# Patient Record
Sex: Male | Born: 1955 | Race: White | Hispanic: No | Marital: Married | Smoking: Former smoker
Health system: Southern US, Community
[De-identification: ages and names within clinical notes are randomized; demographics above are authoritative.]

## PROBLEM LIST (undated history)

## (undated) DIAGNOSIS — G43909 Migraine, unspecified, not intractable, without status migrainosus: Secondary | ICD-10-CM

## (undated) DIAGNOSIS — N50819 Testicular pain, unspecified: Secondary | ICD-10-CM

## (undated) DIAGNOSIS — T7840XA Allergy, unspecified, initial encounter: Secondary | ICD-10-CM

## (undated) DIAGNOSIS — M1990 Unspecified osteoarthritis, unspecified site: Secondary | ICD-10-CM

## (undated) DIAGNOSIS — G8929 Other chronic pain: Secondary | ICD-10-CM

## (undated) DIAGNOSIS — Z973 Presence of spectacles and contact lenses: Secondary | ICD-10-CM

## (undated) DIAGNOSIS — C801 Malignant (primary) neoplasm, unspecified: Secondary | ICD-10-CM

## (undated) HISTORY — PX: TONSILLECTOMY: SUR1361

## (undated) HISTORY — PX: NASAL SEPTUM SURGERY: SHX37

## (undated) HISTORY — DX: Malignant (primary) neoplasm, unspecified: C80.1

## (undated) HISTORY — DX: Allergy, unspecified, initial encounter: T78.40XA

## (undated) HISTORY — PX: ORCHIOPEXY: SUR915

## (undated) HISTORY — PX: ORCHIECTOMY: SHX2116

---

## 1996-08-17 HISTORY — PX: NASAL SEPTUM SURGERY: SHX37

## 1998-11-29 ENCOUNTER — Other Ambulatory Visit: Admission: RE | Admit: 1998-11-29 | Discharge: 1998-11-29 | Payer: Self-pay | Admitting: Urology

## 2003-04-11 ENCOUNTER — Encounter: Payer: Self-pay | Admitting: Internal Medicine

## 2003-04-11 ENCOUNTER — Encounter: Admission: RE | Admit: 2003-04-11 | Discharge: 2003-04-11 | Payer: Self-pay | Admitting: Internal Medicine

## 2004-02-01 ENCOUNTER — Encounter: Admission: RE | Admit: 2004-02-01 | Discharge: 2004-02-01 | Payer: Self-pay | Admitting: Internal Medicine

## 2005-12-03 ENCOUNTER — Ambulatory Visit (HOSPITAL_COMMUNITY): Admission: RE | Admit: 2005-12-03 | Discharge: 2005-12-03 | Payer: Self-pay | Admitting: Orthopedic Surgery

## 2007-08-16 ENCOUNTER — Ambulatory Visit: Payer: Self-pay | Admitting: Gastroenterology

## 2007-08-23 ENCOUNTER — Ambulatory Visit: Payer: Self-pay | Admitting: Gastroenterology

## 2008-08-17 HISTORY — PX: COLONOSCOPY: SHX174

## 2014-12-04 ENCOUNTER — Other Ambulatory Visit: Payer: Self-pay | Admitting: Urology

## 2014-12-06 ENCOUNTER — Encounter (HOSPITAL_BASED_OUTPATIENT_CLINIC_OR_DEPARTMENT_OTHER): Payer: Self-pay | Admitting: *Deleted

## 2014-12-06 NOTE — Progress Notes (Signed)
NPO AFTER MN.  ARRIVE AT 0645.  NEEDS HG.   

## 2014-12-10 ENCOUNTER — Encounter (HOSPITAL_BASED_OUTPATIENT_CLINIC_OR_DEPARTMENT_OTHER): Payer: Self-pay | Admitting: *Deleted

## 2014-12-10 ENCOUNTER — Encounter (HOSPITAL_BASED_OUTPATIENT_CLINIC_OR_DEPARTMENT_OTHER)
Admission: RE | Disposition: A | Discharge status: Home / Self Care | Payer: Self-pay | Source: Ambulatory Visit | Attending: Urology

## 2014-12-10 ENCOUNTER — Ambulatory Visit (HOSPITAL_BASED_OUTPATIENT_CLINIC_OR_DEPARTMENT_OTHER): Payer: BLUE CROSS/BLUE SHIELD | Admitting: Anesthesiology

## 2014-12-10 ENCOUNTER — Ambulatory Visit (HOSPITAL_BASED_OUTPATIENT_CLINIC_OR_DEPARTMENT_OTHER)
Admission: RE | Admit: 2014-12-10 | Discharge: 2014-12-10 | Disposition: A | Discharge status: Home / Self Care | Payer: BLUE CROSS/BLUE SHIELD | Source: Ambulatory Visit | Attending: Urology | Admitting: Urology

## 2014-12-10 DIAGNOSIS — N500 Atrophy of testis: Secondary | ICD-10-CM | POA: Diagnosis not present

## 2014-12-10 DIAGNOSIS — N508 Other specified disorders of male genital organs: Secondary | ICD-10-CM | POA: Diagnosis present

## 2014-12-10 DIAGNOSIS — Z886 Allergy status to analgesic agent status: Secondary | ICD-10-CM | POA: Diagnosis not present

## 2014-12-10 DIAGNOSIS — E785 Hyperlipidemia, unspecified: Secondary | ICD-10-CM | POA: Diagnosis not present

## 2014-12-10 DIAGNOSIS — F1099 Alcohol use, unspecified with unspecified alcohol-induced disorder: Secondary | ICD-10-CM | POA: Diagnosis not present

## 2014-12-10 DIAGNOSIS — Z87891 Personal history of nicotine dependence: Secondary | ICD-10-CM | POA: Insufficient documentation

## 2014-12-10 DIAGNOSIS — Z85828 Personal history of other malignant neoplasm of skin: Secondary | ICD-10-CM | POA: Insufficient documentation

## 2014-12-10 DIAGNOSIS — R350 Frequency of micturition: Secondary | ICD-10-CM | POA: Diagnosis not present

## 2014-12-10 DIAGNOSIS — N50812 Left testicular pain: Secondary | ICD-10-CM

## 2014-12-10 HISTORY — DX: Unspecified osteoarthritis, unspecified site: M19.90

## 2014-12-10 HISTORY — PX: ORCHIECTOMY: SHX2116

## 2014-12-10 HISTORY — DX: Testicular pain, unspecified: N50.819

## 2014-12-10 HISTORY — DX: Migraine, unspecified, not intractable, without status migrainosus: G43.909

## 2014-12-10 HISTORY — DX: Other chronic pain: G89.29

## 2014-12-10 HISTORY — DX: Presence of spectacles and contact lenses: Z97.3

## 2014-12-10 LAB — POCT HEMOGLOBIN-HEMACUE: HEMOGLOBIN: 14.9 g/dL (ref 13.0–17.0)

## 2014-12-10 SURGERY — ORCHIECTOMY
Anesthesia: General | Site: Scrotum | Laterality: Left

## 2014-12-10 MED ORDER — CEFAZOLIN SODIUM-DEXTROSE 2-3 GM-% IV SOLR
INTRAVENOUS | Status: AC
  Filled 2014-12-10: qty 50

## 2014-12-10 MED ORDER — LACTATED RINGERS IV SOLN
INTRAVENOUS | Status: DC
  Administered 2014-12-10 (×2): via INTRAVENOUS
  Filled 2014-12-10: qty 1000

## 2014-12-10 MED ORDER — FENTANYL CITRATE (PF) 100 MCG/2ML IJ SOLN
INTRAMUSCULAR | Status: DC | PRN
  Administered 2014-12-10: 50 ug via INTRAVENOUS
  Administered 2014-12-10: 25 ug via INTRAVENOUS
  Administered 2014-12-10: 50 ug via INTRAVENOUS
  Administered 2014-12-10: 25 ug via INTRAVENOUS
  Administered 2014-12-10: 50 ug via INTRAVENOUS

## 2014-12-10 MED ORDER — MIDAZOLAM HCL 2 MG/2ML IJ SOLN
INTRAMUSCULAR | Status: AC
  Filled 2014-12-10: qty 2

## 2014-12-10 MED ORDER — CEFAZOLIN SODIUM-DEXTROSE 2-3 GM-% IV SOLR
2.0000 g | INTRAVENOUS | Status: AC
  Administered 2014-12-10: 2 g via INTRAVENOUS
  Filled 2014-12-10: qty 50

## 2014-12-10 MED ORDER — HYDROCODONE-ACETAMINOPHEN 5-325 MG PO TABS: 1.0000 | ORAL_TABLET | Freq: Four times a day (QID) | ORAL | Status: DC | PRN

## 2014-12-10 MED ORDER — OXYCODONE HCL 5 MG PO TABS
5.0000 mg | ORAL_TABLET | Freq: Once | ORAL | Status: AC
Start: 2014-12-10 — End: 2014-12-10
  Administered 2014-12-10: 5 mg via ORAL
  Filled 2014-12-10: qty 1

## 2014-12-10 MED ORDER — HYDROMORPHONE HCL 1 MG/ML IJ SOLN
0.2500 mg | INTRAMUSCULAR | Status: DC | PRN
  Administered 2014-12-10: 0.25 mg via INTRAVENOUS
  Filled 2014-12-10: qty 1

## 2014-12-10 MED ORDER — OXYCODONE HCL 5 MG PO TABS
ORAL_TABLET | ORAL | Status: AC
  Filled 2014-12-10: qty 1

## 2014-12-10 MED ORDER — PROPOFOL 10 MG/ML IV BOLUS
INTRAVENOUS | Status: DC | PRN
Start: 2014-12-10 — End: 2014-12-10
  Administered 2014-12-10: 200 mg via INTRAVENOUS

## 2014-12-10 MED ORDER — EPHEDRINE SULFATE 50 MG/ML IJ SOLN
INTRAMUSCULAR | Status: DC | PRN
  Administered 2014-12-10: 10 mg via INTRAVENOUS

## 2014-12-10 MED ORDER — CEFAZOLIN SODIUM 1-5 GM-% IV SOLN
1.0000 g | INTRAVENOUS | Status: DC
  Filled 2014-12-10: qty 50

## 2014-12-10 MED ORDER — BUPIVACAINE HCL (PF) 0.25 % IJ SOLN
INTRAMUSCULAR | Status: DC | PRN
  Administered 2014-12-10: 3 mL

## 2014-12-10 MED ORDER — ONDANSETRON HCL 4 MG/2ML IJ SOLN
INTRAMUSCULAR | Status: DC | PRN
  Administered 2014-12-10: 4 mg via INTRAVENOUS

## 2014-12-10 MED ORDER — DEXAMETHASONE SODIUM PHOSPHATE 4 MG/ML IJ SOLN
INTRAMUSCULAR | Status: DC | PRN
  Administered 2014-12-10: 10 mg via INTRAVENOUS

## 2014-12-10 MED ORDER — HYDROMORPHONE HCL 1 MG/ML IJ SOLN
INTRAMUSCULAR | Status: AC
  Filled 2014-12-10: qty 1

## 2014-12-10 MED ORDER — FENTANYL CITRATE (PF) 100 MCG/2ML IJ SOLN
INTRAMUSCULAR | Status: AC
  Filled 2014-12-10: qty 4

## 2014-12-10 MED ORDER — ACETAMINOPHEN 10 MG/ML IV SOLN
INTRAVENOUS | Status: DC | PRN
  Administered 2014-12-10: 1000 mg via INTRAVENOUS

## 2014-12-10 MED ORDER — MIDAZOLAM HCL 5 MG/5ML IJ SOLN
INTRAMUSCULAR | Status: DC | PRN
  Administered 2014-12-10: 2 mg via INTRAVENOUS

## 2014-12-10 MED ORDER — LIDOCAINE HCL (CARDIAC) 20 MG/ML IV SOLN
INTRAVENOUS | Status: DC | PRN
  Administered 2014-12-10: 60 mg via INTRAVENOUS

## 2014-12-10 SURGICAL SUPPLY — 60 items
APL SKNCLS STERI-STRIP NONHPOA (GAUZE/BANDAGES/DRESSINGS) ×1
BENZOIN TINCTURE PRP APPL 2/3 (GAUZE/BANDAGES/DRESSINGS) ×1 IMPLANT
BLADE CLIPPER SURG (BLADE) ×2 IMPLANT
BLADE SURG 15 STRL LF DISP TIS (BLADE) ×1 IMPLANT
BLADE SURG 15 STRL SS (BLADE) ×2
BNDG GAUZE ELAST 4 BULKY (GAUZE/BANDAGES/DRESSINGS) ×2 IMPLANT
CLEANER CAUTERY TIP 5X5 PAD (MISCELLANEOUS) ×1 IMPLANT
CLEANER INSTRU ENZYM VALS 1GL (STERILIZATION PRODUCTS) ×2 IMPLANT
CLOTH BEACON ORANGE TIMEOUT ST (SAFETY) ×2 IMPLANT
COVER BACK TABLE 60X90IN (DRAPES) ×2 IMPLANT
COVER MAYO STAND STRL (DRAPES) ×2 IMPLANT
DISSECTOR ROUND CHERRY 3/8 STR (MISCELLANEOUS) IMPLANT
DRAIN PENROSE 18X1/4 LTX STRL (WOUND CARE) IMPLANT
DRAPE LAPAROTOMY TRNSV 102X78 (DRAPE) IMPLANT
DRAPE PED LAPAROTOMY (DRAPES) ×2 IMPLANT
DRSG TEGADERM 2-3/8X2-3/4 SM (GAUZE/BANDAGES/DRESSINGS) ×1 IMPLANT
DRSG TEGADERM 4X4.75 (GAUZE/BANDAGES/DRESSINGS) IMPLANT
ELECT NDL TIP 2.8 STRL (NEEDLE) ×1 IMPLANT
ELECT NEEDLE TIP 2.8 STRL (NEEDLE) IMPLANT
ELECT REM PT RETURN 9FT ADLT (ELECTROSURGICAL) ×2
ELECTRODE REM PT RTRN 9FT ADLT (ELECTROSURGICAL) ×1 IMPLANT
GLOVE BIO SURGEON STRL SZ 6.5 (GLOVE) ×1 IMPLANT
GLOVE BIO SURGEON STRL SZ7.5 (GLOVE) ×2 IMPLANT
GLOVE BIOGEL PI IND STRL 6.5 (GLOVE) IMPLANT
GLOVE BIOGEL PI IND STRL 7.5 (GLOVE) IMPLANT
GLOVE BIOGEL PI INDICATOR 6.5 (GLOVE) ×1
GLOVE BIOGEL PI INDICATOR 7.5 (GLOVE) ×1
GOWN STRL REUS W/ TWL LRG LVL3 (GOWN DISPOSABLE) ×1 IMPLANT
GOWN STRL REUS W/TWL LRG LVL3 (GOWN DISPOSABLE) ×2
GOWN STRL REUS W/TWL XL LVL3 (GOWN DISPOSABLE) ×1 IMPLANT
NDL HYPO 25X1 1.5 SAFETY (NEEDLE) ×1 IMPLANT
NEEDLE HYPO 25X1 1.5 SAFETY (NEEDLE) ×2 IMPLANT
NS IRRIG 500ML POUR BTL (IV SOLUTION) IMPLANT
PACK BASIN DAY SURGERY FS (CUSTOM PROCEDURE TRAY) ×2 IMPLANT
PAD CLEANER CAUTERY TIP 5X5 (MISCELLANEOUS) ×1
PENCIL BUTTON HOLSTER BLD 10FT (ELECTRODE) ×2 IMPLANT
STRIP CLOSURE SKIN 1/2X4 (GAUZE/BANDAGES/DRESSINGS) ×2 IMPLANT
SUPPORT SCROTAL LG STRP (MISCELLANEOUS) ×2 IMPLANT
SUT PROLENE 4 0 RB 1 (SUTURE)
SUT PROLENE 4-0 RB1 .5 CRCL 36 (SUTURE) IMPLANT
SUT SILK 0 SH 30 (SUTURE) ×3 IMPLANT
SUT SILK 0 TIES 10X30 (SUTURE) ×2 IMPLANT
SUT VIC AB 2-0 CT1 27 (SUTURE)
SUT VIC AB 2-0 CT1 TAPERPNT 27 (SUTURE) IMPLANT
SUT VIC AB 3-0 CT1 36 (SUTURE) IMPLANT
SUT VIC AB 3-0 SH 27 (SUTURE) ×4
SUT VIC AB 3-0 SH 27X BRD (SUTURE) ×2 IMPLANT
SUT VIC AB 4-0 BRD 54 (SUTURE) IMPLANT
SUT VIC AB 4-0 P-3 18XBRD (SUTURE) IMPLANT
SUT VIC AB 4-0 P3 18 (SUTURE)
SUT VIC AB 4-0 RB1 18 (SUTURE) IMPLANT
SUT VIC AB 5-0 P-3 18X BRD (SUTURE) IMPLANT
SUT VIC AB 5-0 P3 18 (SUTURE)
SUT VICRYL 4-0 PS2 18IN ABS (SUTURE) ×2 IMPLANT
SYR CONTROL 10ML LL (SYRINGE) ×2 IMPLANT
TOWEL OR 17X24 6PK STRL BLUE (TOWEL DISPOSABLE) ×4 IMPLANT
TRAY DSU PREP LF (CUSTOM PROCEDURE TRAY) ×2 IMPLANT
TUBE CONNECTING 12X1/4 (SUCTIONS) ×2 IMPLANT
WATER STERILE IRR 500ML POUR (IV SOLUTION) IMPLANT
YANKAUER SUCT BULB TIP NO VENT (SUCTIONS) ×2 IMPLANT

## 2014-12-10 NOTE — Anesthesia Procedure Notes (Signed)
Procedure Name: LMA Insertion Date/Time: 12/10/2014 8:22 AM Performed by: Mechele Claude Pre-anesthesia Checklist: Patient identified, Emergency Drugs available, Suction available and Patient being monitored Patient Re-evaluated:Patient Re-evaluated prior to inductionOxygen Delivery Method: Circle System Utilized Preoxygenation: Pre-oxygenation with 100% oxygen Intubation Type: IV induction Ventilation: Mask ventilation without difficulty LMA: LMA inserted LMA Size: 5.0 Number of attempts: 1 Airway Equipment and Method: bite block Placement Confirmation: positive ETCO2 Tube secured with: Tape Dental Injury: Teeth and Oropharynx as per pre-operative assessment

## 2014-12-10 NOTE — H&P (Signed)
History of Present Illness         Carlos Booth presents today to reestablish as a new patient for ongoing assessment and potential management of chronic left-sided testicular discomfort. Carlos Booth is currently 59 years of age. Again, he has a prior history of undescended testicle which was surgically corrected at age 45. Dating back now for 3-4 years, he has had chronic mild discomfort within the left testicle. He has noticed ongoing increased sensitivity in that area. Physical activity becomes less enjoyable secondary to discomfort. The pain is never severe but is constant and chronically problematic. When we assessed him clinically last time he was noted to have testicular atrophy. There was no evidence of testicular mass. No evidence of epididymitis. He had no voiding complaints at that time and his urine was clear. He has continued to have discomfort now for the last 3-4 years. This is becoming more and more annoying and bothersome to him. He has developed some increase in urinary frequency and nocturia. He does not have much in the way of obstructive symptoms. PSA testing within the last year was well within normal limits at 1.0. He has no erectile dysfunction or other complaints or concerns today. He is interested in pursuing more definitive management of his testicular discomfort, if possible, and also wants to be sure nothing more significant is going on.         Past Medical History Problems  1. History of Arthritis 2. History of allergy (Z88.9) 3. History of hyperlipidemia (Z86.39) 4. History of squamous cell carcinoma of skin (Z60.109)  Surgical History Problems  1. History of Nasal Septal Deviation Repair 2. History of Surgery Testis Exploration Of Undescended Testis  Current Meds 1. Norvasc 10 MG Oral Tablet (AmLODIPine Besylate); 1 per day;  Therapy: (Recorded:18Sep2012) to Recorded  Allergies Medication  1. Morphine Derivatives  Family History Problems  1. Family history of  Bladder Cancer : Father 2. Family history of CABG (CABG) : Mother 3. Family history of Family Health Status - Mother's Age   18 4. Family history of Family Health Status Number Of Children   1 daughter 5. Family history of Father Deceased At Age ____   72 / Bladder Cancer  Social History Problems    Alcohol Use   1-2 daily at night   Caffeine Use   1-2 per day   Father deceased   70yrs cancer   Former smoker [phone])   Smoked 1 pack or less daily; Smoked for 12 years; Quit smoking 25 years ago; Denies     any other forms of tobacco use.   Marital History - Currently Married   Mother deceased   50yrs, Alzheimers   Occupation:   Electrical engineer   One child  Review of Systems Genitourinary, constitutional, skin, eye, otolaryngeal, hematologic/lymphatic, cardiovascular, pulmonary, endocrine, musculoskeletal, gastrointestinal, neurological and psychiatric system(s) were reviewed and pertinent findings if present are noted and are otherwise negative.  Genitourinary: urinary frequency, nocturia, testicular pain and scrotal pain.  Constitutional: feeling tired (fatigue).  Neurological: headache.    Vitals Vital Signs [Data Includes: Last 1 Day]  Recorded: 19Apr2016 08:37AM  Height: 5 ft 9.5 in Weight: 166 lb  BMI Calculated: 24.16 BSA Calculated: 1.92 Blood Pressure: 125 / 76 Temperature: 97.2 F Heart Rate: 62  Physical Exam Constitutional: Well nourished and well developed . No acute distress.  ENT:. The ears and nose are normal in appearance.  Neck: The appearance of the neck is normal and no neck mass is present.  Pulmonary:  No respiratory distress and normal respiratory rhythm and effort.  Cardiovascular: Heart rate and rhythm are normal . No peripheral edema.  Abdomen: The abdomen is soft and nontender. No masses are palpated. No CVA tenderness. No hernias are palpable. No hepatosplenomegaly noted.  Rectal: Rectal exam demonstrates normal sphincter tone, no  tenderness and no masses. Estimated prostate size is 1+. Normal rectal tone, no rectal masses, prostate is smooth, symmetric and non-tender. The prostate has no nodularity and is not tender. The left seminal vesicle is nonpalpable. The right seminal vesicle is nonpalpable. The perineum is normal on inspection.  Genitourinary: Examination of the penis demonstrates no discharge, no masses, no lesions and a normal meatus. The scrotum is without lesions. The right epididymis is palpably normal and non-tender. The left epididymis is palpably normal and non-tender. The right testis is non-tender and without masses. The left testis is atrophic, but non-tender and without masses.  Skin: Normal skin turgor, no visible rash and no visible skin lesions.  Neuro/Psych:. Mood and affect are appropriate.    Results/Data Urine [Data Includes: Last 1 Day]   [id number]  COLOR YELLOW   APPEARANCE CLEAR   SPECIFIC GRAVITY 1.010   pH 7.0   GLUCOSE NEG mg/dL  BILIRUBIN NEG   KETONE NEG mg/dL  BLOOD TRACE   PROTEIN NEG mg/dL  UROBILINOGEN 0.2 mg/dL  NITRITE NEG   LEUKOCYTE ESTERASE NEG   SQUAMOUS EPITHELIAL/HPF NONE SEEN   WBC NONE SEEN WBC/hpf  RBC 0-2 RBC/hpf  BACTERIA NONE SEEN   CRYSTALS NONE SEEN   CASTS NONE SEEN    Procedure   A spermatic cord block was performed today; 6 mL of 1% lidocaine was injected in the left spermatic cord. With that his pain resolved and palpably I was able to squeeze that testicle without eliciting any discomfort. More careful palpation revealed again no evidence of pathology. Again, this did resolve his discomfort.     Assessment Assessed  1. Testicular pain (N50.8) 2. Increased urinary frequency (R35.0) 3. Testicular atrophy (N50.0)  Plan Health Maintenance  1. UA With REFLEX; [Do Not Release]; Status:Complete;   Done: [id number] 08:22AM Testicular atrophy  2. TESTOSTERONE; Status:Hold For - Specimen/Data Collection,Appointment; Requested  for:19Apr2016;  3.  Follow-up Schedule Surgery Office  Follow-up  Status: Hold For - Appointment   Requested for: 19Apr2016  Discussion/Summary   Carlos Booth has had approximately 4 years of chronic left-sided testicular discomfort. He does have moderate atrophy of that left testicle. It is palpably very sensitive. There does not appear to be more significant pathology. There is no evidence of an infectious etiology. The etiology for chronic orchalgia often is difficult to ascertain. This is probably related to some nerve irritation and increased sensitivity in that area. He did respond very nicely to spermatic cord block which certainly suggests a much higher likelihood that an orchiectomy will resolve his discomfort. That testicle is not really serving any useful purpose at this time. Removal of the testicle may theoretically reduce his testosterone slightly but we certainly could replace it if that became clinically significant. A simple scrotal orchiectomy is certainly a very reasonable option in his situation and he would like to pursue that. We will try to set something up for sometime in the next several weeks to several months at his convenience depending on scheduling. He does have some mild voiding symptoms but nothing that is problematic. Slight BPH on rectal exam with normal PSA testing. Would recommend ongoing observation.   cc: Gaylan Gerold, MD  Signatures Electronically signed by : Rana Snare, M.D.; Dec 04 2014  1:07PM EST

## 2014-12-10 NOTE — Transfer of Care (Addendum)
Last Vitals:  Filed Vitals:   12/10/14 0700  BP: 131/78  Pulse: 60  Temp: 36.6 C  Resp: 20    Immediate Anesthesia Transfer of Care Note  Patient: Carlos Booth  Procedure(s) Performed: Procedure(s) (LRB): ORCHIECTOMY (Left)  Patient Location: PACU  Anesthesia Type: General  Level of Consciousness: awake, alert  and oriented  Airway & Oxygen Therapy: Patient Spontanous Breathing and Patient connected to nasal cannula oxygen  Post-op Assessment: Report given to PACU RN and Post -op Vital signs reviewed and stable  Post vital signs: Reviewed and stable  Complications: No apparent anesthesia complications

## 2014-12-10 NOTE — Anesthesia Preprocedure Evaluation (Signed)
Anesthesia Evaluation  Patient identified by MRN, date of birth, ID band Patient awake    Reviewed: Allergy & Precautions, H&P , NPO status , Patient's Chart, lab work & pertinent test results  Airway Mallampati: I  TM Distance: >3 FB Neck ROM: Full    Dental no notable dental hx. (+) Teeth Intact, Dental Advisory Given   Pulmonary neg pulmonary ROS, former smoker,  breath sounds clear to auscultation  Pulmonary exam normal       Cardiovascular negative cardio ROS  Rhythm:Regular Rate:Normal     Neuro/Psych  Headaches, negative psych ROS   GI/Hepatic negative GI ROS, Neg liver ROS,   Endo/Other  negative endocrine ROS  Renal/GU negative Renal ROS  negative genitourinary   Musculoskeletal   Abdominal   Peds  Hematology negative hematology ROS (+)   Anesthesia Other Findings   Reproductive/Obstetrics negative OB ROS                             Anesthesia Physical Anesthesia Plan  ASA: II  Anesthesia Plan: General   Post-op Pain Management:    Induction: Intravenous  Airway Management Planned: LMA  Additional Equipment:   Intra-op Plan:   Post-operative Plan: Extubation in OR  Informed Consent: I have reviewed the patients History and Physical, chart, labs and discussed the procedure including the risks, benefits and alternatives for the proposed anesthesia with the patient or authorized representative who has indicated his/her understanding and acceptance.   Dental advisory given  Plan Discussed with: CRNA  Anesthesia Plan Comments:         Anesthesia Quick Evaluation

## 2014-12-10 NOTE — Anesthesia Postprocedure Evaluation (Signed)
  Anesthesia Post-op Note  Patient: Carlos Booth  Procedure(s) Performed: Procedure(s): ORCHIECTOMY (Left)  Patient Location: PACU  Anesthesia Type:General  Level of Consciousness: awake and alert   Airway and Oxygen Therapy: Patient Spontanous Breathing  Post-op Pain: moderate  Post-op Assessment: Post-op Vital signs reviewed, Patient's Cardiovascular Status Stable and Respiratory Function Stable  Post-op Vital Signs: Reviewed  Filed Vitals:   12/10/14 1000  BP: 142/94  Pulse: 68  Temp:   Resp: 16    Complications: No apparent anesthesia complications

## 2014-12-10 NOTE — Interval H&P Note (Signed)
History and Physical Interval Note:  12/10/2014 7:38 AM  Carlos Booth  has presented today for surgery, with the diagnosis of CHRONIC LEFT TESTICULAR PAIN  The various methods of treatment have been discussed with the patient and family. After consideration of risks, benefits and other options for treatment, the patient has consented to  Procedure(s) with comments: ORCHIECTOMY (Left) - 30 MINS REQUESTED FOR THIS CASE  [phone] HOME [phone] [place] GRP # [id number] ID # [id number] as a surgical intervention .  The patient's history has been reviewed, patient examined, no change in status, stable for surgery.  I have reviewed the patient's chart and labs.  Questions were answered to the patient's satisfaction.     Katrinia Straker S

## 2014-12-10 NOTE — Op Note (Signed)
Preoperative diagnosis: Left chronic orchialgia Postoperative diagnosis: Same  Procedure: Left simple orchiectomy scrotal approach   Surgeon: Bernestine Amass M.D.  Anesthesia: Gen.  Indications: Patient is 59 years of age. He has a prior history of left cryptorchidism. He has had for years of chronic left testicular discomfort. His left testicle is atrophic but showed no other significant pathology. A spermatic cord block was performed in our office with resolution of his discomfort. He requested orchiectomy. Advantages disadvantages approach appear to be understood by the patient.     Technique and findings: Patient was brought the operating room where he had successful induction of general anesthesia. He was prepped and draped in usual manner and 8 appropriate surgical timeout was performed. A midline scrotal incision was performed through the median raphae. The left scrotal compartment was entered. A atrophic but otherwise normal-appearing testis was encountered. The spermatic cord was divided into 2 bundles. Both bundles were ligated with double ties of silk suture. Marcaine spermatic cord block was performed. The testis was removed. The scrotal compartment was copiously irrigated and then closed with multiple layers of Vicryl suture. No obvious complications occurred and the patient was brought to recovery room in stable condition.

## 2014-12-10 NOTE — Discharge Instructions (Signed)
Post Anesthesia Home Care Instructions  Activity: Get plenty of rest for the remainder of the day. A responsible adult should stay with you for 24 hours following the procedure.  For the next 24 hours, DO NOT: -Drive a car -Paediatric nurse -Drink alcoholic beverages -Take any medication unless instructed by your physician -Make any legal decisions or sign important papers.  Meals: Start with liquid foods such as gelatin or soup. Progress to regular foods as tolerated. Avoid greasy, spicy, heavy foods. If nausea and/or vomiting occur, drink only clear liquids until the nausea and/or vomiting subsides. Call your physician if vomiting continues.  Special Instructions/Symptoms: Your throat may feel dry or sore from the anesthesia or the breathing tube placed in your throat during surgery. If this causes discomfort, gargle with warm salt water. The discomfort should disappear within 24 hours.  If you had a scopolamine patch placed behind your ear for the management of post- operative nausea and/or vomiting:  1. The medication in the patch is effective for 72 hours, after which it should be removed.  Wrap patch in a tissue and discard in the trash. Wash hands thoroughly with soap and water. 2. You may remove the patch earlier than 72 hours if you experience unpleasant side effects which may include dry mouth, dizziness or visual disturbances. 3. Avoid touching the patch. Wash your hands with soap and water after contact with the patch.    HOME CARE INSTRUCTIONS FOR SCROTAL PROCEDURES  Wound Care & Hygiene: You may apply an ice bag to the scrotum for the first 24 hours.  This may help decrease swelling and soreness.  You may have a dressing held in place by an athletic supporter.  You may remove the dressing in 24 hours and shower in 48 hours.  Continue to use the athletic supporter or tight briefs for at least a week. Activity: Rest today - not necessarily flat bed rest.  Just take it easy.   You should not do strenuous activities until your follow-up visit with your doctor.  You may resume light activity in 48 hours.  Return to Work:  Your doctor will advise you of this depending on the type of work you do  Diet: Drink liquids or eat a light diet this evening.  You may resume a regular diet tomorrow.  General Expectations: You may have a small amount of bleeding.  The scrotum may be swollen or bruised for about a week.  Call your Doctor if these occur:  -persistent or heavy bleeding  -temperature of 101 degrees or more  -severe pain, not relieved by your pain medication  Return to [employer]:  Call to set up and appointment.  Patient Signature:  __________________________________________________  Nurse's Signature:  __________________________________________________  HOME CARE INSTRUCTIONS FOR SCROTAL PROCEDURES  Wound Care & Hygiene: You may apply an ice bag to the scrotum for the first 24 hours.  This may help decrease swelling and soreness.  You may have a dressing held in place by an athletic supporter.  You may remove the dressing in 24 hours and shower in 48 hours.  Continue to use the athletic supporter or tight briefs for at least a week. Activity: Rest today - not necessarily flat bed rest.  Just take it easy.  You should not do strenuous activities until your follow-up visit with your doctor.  You may resume light activity in 48 hours.  Return to Work:  Your doctor will advise you of this depending on the type of  work you do  Diet: Drink liquids or eat a light diet this evening.  You may resume a regular diet tomorrow.  General Expectations: You may have a small amount of bleeding.  The scrotum may be swollen or bruised for about a week.  Call your Doctor if these occur:  -persistent or heavy bleeding  -temperature of 101 degrees or more  -severe pain, not relieved by your pain medication  Return to [employer]:  Call to set up and  appointment.  Patient Signature:  __________________________________________________  Nurse's Signature:  __________________________________________________

## 2014-12-11 ENCOUNTER — Encounter (HOSPITAL_BASED_OUTPATIENT_CLINIC_OR_DEPARTMENT_OTHER): Payer: Self-pay | Admitting: Urology

## 2016-06-02 ENCOUNTER — Encounter: Payer: Self-pay | Admitting: Gastroenterology

## 2016-08-03 ENCOUNTER — Ambulatory Visit: Payer: BLUE CROSS/BLUE SHIELD | Admitting: Gastroenterology

## 2016-09-04 DIAGNOSIS — M1712 Unilateral primary osteoarthritis, left knee: Secondary | ICD-10-CM | POA: Diagnosis not present

## 2016-10-01 DIAGNOSIS — D044 Carcinoma in situ of skin of scalp and neck: Secondary | ICD-10-CM | POA: Diagnosis not present

## 2016-10-01 DIAGNOSIS — L570 Actinic keratosis: Secondary | ICD-10-CM | POA: Diagnosis not present

## 2016-12-17 DIAGNOSIS — M1712 Unilateral primary osteoarthritis, left knee: Secondary | ICD-10-CM | POA: Diagnosis not present

## 2017-01-16 DIAGNOSIS — M1712 Unilateral primary osteoarthritis, left knee: Secondary | ICD-10-CM | POA: Diagnosis not present

## 2017-01-28 DIAGNOSIS — Z0000 Encounter for general adult medical examination without abnormal findings: Secondary | ICD-10-CM | POA: Diagnosis not present

## 2017-01-28 DIAGNOSIS — Z6824 Body mass index (BMI) 24.0-24.9, adult: Secondary | ICD-10-CM | POA: Diagnosis not present

## 2017-04-20 DIAGNOSIS — M7122 Synovial cyst of popliteal space [Baker], left knee: Secondary | ICD-10-CM | POA: Diagnosis not present

## 2017-04-20 DIAGNOSIS — M1712 Unilateral primary osteoarthritis, left knee: Secondary | ICD-10-CM | POA: Diagnosis not present

## 2017-04-22 DIAGNOSIS — L309 Dermatitis, unspecified: Secondary | ICD-10-CM | POA: Diagnosis not present

## 2017-04-22 DIAGNOSIS — L570 Actinic keratosis: Secondary | ICD-10-CM | POA: Diagnosis not present

## 2017-04-28 DIAGNOSIS — M7122 Synovial cyst of popliteal space [Baker], left knee: Secondary | ICD-10-CM | POA: Diagnosis not present

## 2017-05-12 DIAGNOSIS — M1712 Unilateral primary osteoarthritis, left knee: Secondary | ICD-10-CM | POA: Diagnosis not present

## 2017-05-18 DIAGNOSIS — M1712 Unilateral primary osteoarthritis, left knee: Secondary | ICD-10-CM | POA: Diagnosis not present

## 2017-05-26 DIAGNOSIS — M1712 Unilateral primary osteoarthritis, left knee: Secondary | ICD-10-CM | POA: Diagnosis not present

## 2017-05-28 DIAGNOSIS — Z125 Encounter for screening for malignant neoplasm of prostate: Secondary | ICD-10-CM | POA: Diagnosis not present

## 2017-05-28 DIAGNOSIS — R7301 Impaired fasting glucose: Secondary | ICD-10-CM | POA: Diagnosis not present

## 2017-05-28 DIAGNOSIS — Z0000 Encounter for general adult medical examination without abnormal findings: Secondary | ICD-10-CM | POA: Diagnosis not present

## 2017-05-28 DIAGNOSIS — E7849 Other hyperlipidemia: Secondary | ICD-10-CM | POA: Diagnosis not present

## 2017-06-04 DIAGNOSIS — Z0000 Encounter for general adult medical examination without abnormal findings: Secondary | ICD-10-CM | POA: Diagnosis not present

## 2017-06-04 DIAGNOSIS — I10 Essential (primary) hypertension: Secondary | ICD-10-CM | POA: Diagnosis not present

## 2017-06-04 DIAGNOSIS — M25562 Pain in left knee: Secondary | ICD-10-CM | POA: Diagnosis not present

## 2017-06-04 DIAGNOSIS — R7301 Impaired fasting glucose: Secondary | ICD-10-CM | POA: Diagnosis not present

## 2017-06-04 DIAGNOSIS — Z1389 Encounter for screening for other disorder: Secondary | ICD-10-CM | POA: Diagnosis not present

## 2017-06-04 DIAGNOSIS — Z1212 Encounter for screening for malignant neoplasm of rectum: Secondary | ICD-10-CM | POA: Diagnosis not present

## 2017-06-04 DIAGNOSIS — E7849 Other hyperlipidemia: Secondary | ICD-10-CM | POA: Diagnosis not present

## 2017-08-19 DIAGNOSIS — M25562 Pain in left knee: Secondary | ICD-10-CM | POA: Diagnosis not present

## 2017-08-19 DIAGNOSIS — M1712 Unilateral primary osteoarthritis, left knee: Secondary | ICD-10-CM | POA: Diagnosis not present

## 2017-09-20 ENCOUNTER — Encounter: Payer: Self-pay | Admitting: Gastroenterology

## 2017-12-13 DIAGNOSIS — D225 Melanocytic nevi of trunk: Secondary | ICD-10-CM | POA: Diagnosis not present

## 2017-12-13 DIAGNOSIS — L821 Other seborrheic keratosis: Secondary | ICD-10-CM | POA: Diagnosis not present

## 2017-12-13 DIAGNOSIS — L814 Other melanin hyperpigmentation: Secondary | ICD-10-CM | POA: Diagnosis not present

## 2017-12-13 DIAGNOSIS — Z85828 Personal history of other malignant neoplasm of skin: Secondary | ICD-10-CM | POA: Diagnosis not present

## 2018-03-03 DIAGNOSIS — R58 Hemorrhage, not elsewhere classified: Secondary | ICD-10-CM | POA: Diagnosis not present

## 2018-03-03 DIAGNOSIS — R1032 Left lower quadrant pain: Secondary | ICD-10-CM | POA: Diagnosis not present

## 2018-05-03 DIAGNOSIS — H04123 Dry eye syndrome of bilateral lacrimal glands: Secondary | ICD-10-CM | POA: Diagnosis not present

## 2018-05-12 DIAGNOSIS — R361 Hematospermia: Secondary | ICD-10-CM | POA: Diagnosis not present

## 2018-10-13 DIAGNOSIS — E7849 Other hyperlipidemia: Secondary | ICD-10-CM | POA: Diagnosis not present

## 2018-10-13 DIAGNOSIS — R82998 Other abnormal findings in urine: Secondary | ICD-10-CM | POA: Diagnosis not present

## 2018-10-13 DIAGNOSIS — Z0000 Encounter for general adult medical examination without abnormal findings: Secondary | ICD-10-CM | POA: Diagnosis not present

## 2018-10-13 DIAGNOSIS — R7301 Impaired fasting glucose: Secondary | ICD-10-CM | POA: Diagnosis not present

## 2018-10-13 DIAGNOSIS — Z125 Encounter for screening for malignant neoplasm of prostate: Secondary | ICD-10-CM | POA: Diagnosis not present

## 2018-10-17 DIAGNOSIS — Z1212 Encounter for screening for malignant neoplasm of rectum: Secondary | ICD-10-CM | POA: Diagnosis not present

## 2018-10-20 DIAGNOSIS — R7301 Impaired fasting glucose: Secondary | ICD-10-CM | POA: Diagnosis not present

## 2018-10-20 DIAGNOSIS — Z23 Encounter for immunization: Secondary | ICD-10-CM | POA: Diagnosis not present

## 2018-10-20 DIAGNOSIS — G43909 Migraine, unspecified, not intractable, without status migrainosus: Secondary | ICD-10-CM | POA: Diagnosis not present

## 2018-10-20 DIAGNOSIS — E7849 Other hyperlipidemia: Secondary | ICD-10-CM | POA: Diagnosis not present

## 2018-10-20 DIAGNOSIS — Z0000 Encounter for general adult medical examination without abnormal findings: Secondary | ICD-10-CM | POA: Diagnosis not present

## 2018-10-20 DIAGNOSIS — Z125 Encounter for screening for malignant neoplasm of prostate: Secondary | ICD-10-CM | POA: Diagnosis not present

## 2018-10-20 DIAGNOSIS — R030 Elevated blood-pressure reading, without diagnosis of hypertension: Secondary | ICD-10-CM | POA: Diagnosis not present

## 2019-04-15 DIAGNOSIS — M79605 Pain in left leg: Secondary | ICD-10-CM | POA: Diagnosis not present

## 2019-04-15 DIAGNOSIS — M25572 Pain in left ankle and joints of left foot: Secondary | ICD-10-CM | POA: Diagnosis not present

## 2019-04-15 DIAGNOSIS — M79672 Pain in left foot: Secondary | ICD-10-CM | POA: Diagnosis not present

## 2019-04-15 DIAGNOSIS — L03116 Cellulitis of left lower limb: Secondary | ICD-10-CM | POA: Diagnosis not present

## 2019-08-28 ENCOUNTER — Encounter: Payer: Self-pay | Admitting: Gastroenterology

## 2019-08-29 DIAGNOSIS — H2513 Age-related nuclear cataract, bilateral: Secondary | ICD-10-CM | POA: Diagnosis not present

## 2019-08-29 DIAGNOSIS — H5203 Hypermetropia, bilateral: Secondary | ICD-10-CM | POA: Diagnosis not present

## 2019-09-06 ENCOUNTER — Encounter: Payer: Self-pay | Admitting: Gastroenterology

## 2019-09-06 ENCOUNTER — Other Ambulatory Visit: Payer: Self-pay

## 2019-09-06 ENCOUNTER — Ambulatory Visit (AMBULATORY_SURGERY_CENTER): Payer: Self-pay

## 2019-09-06 VITALS — Temp 96.8°F | Ht 71.0 in | Wt 172.6 lb

## 2019-09-06 DIAGNOSIS — Z01818 Encounter for other preprocedural examination: Secondary | ICD-10-CM

## 2019-09-06 DIAGNOSIS — Z1211 Encounter for screening for malignant neoplasm of colon: Secondary | ICD-10-CM

## 2019-09-06 MED ORDER — NA SULFATE-K SULFATE-MG SULF 17.5-3.13-1.6 GM/177ML PO SOLN: 1.0000 | Freq: Once | ORAL | 0 refills | Status: AC

## 2019-09-06 NOTE — Progress Notes (Signed)

## 2019-09-14 ENCOUNTER — Other Ambulatory Visit: Payer: Self-pay | Admitting: Gastroenterology

## 2019-09-14 ENCOUNTER — Ambulatory Visit (INDEPENDENT_AMBULATORY_CARE_PROVIDER_SITE_OTHER): Payer: BC Managed Care – PPO

## 2019-09-14 DIAGNOSIS — Z1159 Encounter for screening for other viral diseases: Secondary | ICD-10-CM

## 2019-09-15 LAB — SARS CORONAVIRUS 2 (TAT 6-24 HRS): SARS Coronavirus 2: NEGATIVE

## 2019-09-19 ENCOUNTER — Other Ambulatory Visit: Payer: Self-pay

## 2019-09-19 ENCOUNTER — Encounter: Payer: Self-pay | Admitting: Gastroenterology

## 2019-09-19 ENCOUNTER — Ambulatory Visit: Payer: BC Managed Care – PPO | Admitting: Gastroenterology

## 2019-09-19 VITALS — BP 113/77 | HR 55 | Temp 96.8°F | Resp 15 | Ht 71.0 in | Wt 172.6 lb

## 2019-09-19 DIAGNOSIS — D123 Benign neoplasm of transverse colon: Secondary | ICD-10-CM | POA: Diagnosis not present

## 2019-09-19 DIAGNOSIS — D122 Benign neoplasm of ascending colon: Secondary | ICD-10-CM

## 2019-09-19 DIAGNOSIS — Z1211 Encounter for screening for malignant neoplasm of colon: Secondary | ICD-10-CM

## 2019-09-19 DIAGNOSIS — D125 Benign neoplasm of sigmoid colon: Secondary | ICD-10-CM | POA: Diagnosis not present

## 2019-09-19 DIAGNOSIS — D129 Benign neoplasm of anus and anal canal: Secondary | ICD-10-CM

## 2019-09-19 DIAGNOSIS — K6289 Other specified diseases of anus and rectum: Secondary | ICD-10-CM | POA: Diagnosis not present

## 2019-09-19 MED ORDER — SODIUM CHLORIDE 0.9 % IV SOLN: 500.0000 mL | Freq: Once | INTRAVENOUS | Status: DC

## 2019-09-19 NOTE — Progress Notes (Signed)
Called to room to assist during endoscopic procedure.  Patient ID and intended procedure confirmed with present staff. Received instructions for my participation in the procedure from the performing physician.  

## 2019-09-19 NOTE — Progress Notes (Signed)
No problems noted in the recovery room. maw 

## 2019-09-19 NOTE — Op Note (Signed)
[name] Patient Name: Carlos Booth Procedure Date: 09/19/2019 7:51 AM MRN: [id number] Endoscopist: Remo Lipps P. Havery Moros , MD Age: 64 Referring MD:  Date of Birth: May 01, 1956 Gender: Male Account #: [id number] Procedure:                Colonoscopy Indications:              Screening for colorectal malignant neoplasm Medicines:                Monitored Anesthesia Care Procedure:                Pre-Anesthesia Assessment:                           - Prior to the procedure, a History and Physical                            was performed, and patient medications and                            allergies were reviewed. The patient's tolerance of                            previous anesthesia was also reviewed. The risks                            and benefits of the procedure and the sedation                            options and risks were discussed with the patient.                            All questions were answered, and informed consent                            was obtained. Prior Anticoagulants: The patient has                            taken no previous anticoagulant or antiplatelet                            agents. ASA Grade Assessment: II - A patient with                            mild systemic disease. After reviewing the risks                            and benefits, the patient was deemed in                            satisfactory condition to undergo the procedure.                           After obtaining informed consent, the colonoscope  was passed under direct vision. Throughout the                            procedure, the patient's blood pressure, pulse, and                            oxygen saturations were monitored continuously. The                            Colonoscope was introduced through the anus and                            advanced to the the cecum, identified by                            appendiceal orifice and  ileocecal valve. The                            colonoscopy was performed without difficulty. The                            patient tolerated the procedure well. The quality                            of the bowel preparation was good. The ileocecal                            valve, appendiceal orifice, and rectum were                            photographed. Scope In: 8:22:47 AM Scope Out: 8:58:18 AM Scope Withdrawal Time: 0 hours 31 minutes 13 seconds  Total Procedure Duration: 0 hours 35 minutes 31 seconds  Findings:                 The perianal and digital rectal examinations were                            normal.                           A single medium-sized angiodysplastic lesion was                            found in the cecum.                           A 3 mm polyp was found in the ascending colon. The                            polyp was sessile. The polyp was removed with a                            cold snare. Resection and retrieval were complete.  A 4 mm polyp was found in the transverse colon. The                            polyp was flat. The polyp was removed with a cold                            snare. Resection and retrieval were complete.                           A 3 mm polyp was found in the sigmoid colon. The                            polyp was sessile. The polyp was removed with a                            cold snare. Resection and retrieval were complete.                           An area of suspected prolapse / hyperplastic tissue                            was found at the dentate line overlying                            hemorrhoidal tissue. I suspect benign prolapse                            change but biopsies were taken with a cold forceps                            for histology to ensure no AIN.                           Internal hemorrhoids were found during retroflexion.                           The colon was tortuous  which prolonged the exam.                           The exam was otherwise without abnormality. Of                            note, due to technical issues with the processor,                            only 2 photosgraphs that were taken were recorded                            unfortunately. Complications:            No immediate complications. Estimated blood loss:  Minimal. Estimated Blood Loss:     Estimated blood loss was minimal. Impression:               - A single colonic angiodysplastic lesion in the                            cecum.                           - One 3 mm polyp in the ascending colon, removed                            with a cold snare. Resected and retrieved.                           - One 4 mm polyp in the transverse colon, removed                            with a cold snare. Resected and retrieved.                           - One 3 mm polyp in the sigmoid colon, removed with                            a cold snare. Resected and retrieved.                           - Abnormal mucosa at the dentate line as outlined,                            suspect benign prolapse change. Biopsied.                           - Internal hemorrhoids.                           - Tortuous colon.                           - The examination was otherwise normal. Recommendation:           - Patient has a contact number available for                            emergencies. The signs and symptoms of potential                            delayed complications were discussed with the                            patient. Return to normal activities tomorrow.                            Written discharge instructions were provided to the  patient.                           - Resume previous diet.                           - Continue present medications.                           - Await pathology results. Remo Lipps P. Makayli Bracken,  MD 09/19/2019 9:05:39 AM This report has been signed electronically.

## 2019-09-19 NOTE — Patient Instructions (Addendum)
YOU HAD AN ENDOSCOPIC PROCEDURE TODAY AT [facility]:   Refer to the procedure report that was given to you for any specific questions about what was found during the examination.  If the procedure report does not answer your questions, please call your gastroenterologist to clarify.  If you requested that your care partner not be given the details of your procedure findings, then the procedure report has been included in a sealed envelope for you to review at your convenience later.  YOU SHOULD EXPECT: Some feelings of bloating in the abdomen. Passage of more gas than usual.  Walking can help get rid of the air that was put into your GI tract during the procedure and reduce the bloating. If you had a lower endoscopy (such as a colonoscopy or flexible sigmoidoscopy) you may notice spotting of blood in your stool or on the toilet paper. If you underwent a bowel prep for your procedure, you may not have a normal bowel movement for a few days.  Please Note:  You might notice some irritation and congestion in your nose or some drainage.  This is from the oxygen used during your procedure.  There is no need for concern and it should clear up in a day or so.  SYMPTOMS TO REPORT IMMEDIATELY:   Following lower endoscopy (colonoscopy or flexible sigmoidoscopy):  Excessive amounts of blood in the stool  Significant tenderness or worsening of abdominal pains  Swelling of the abdomen that is new, acute  Fever of 100F or higher    For urgent or emergent issues, a gastroenterologist can be reached at any hour by calling [phone].   DIET:  We do recommend a small meal at first, but then you may proceed to your regular diet.  Drink plenty of fluids but you should avoid alcoholic beverages for 24 hours.  ACTIVITY:  You should plan to take it easy for the rest of today and you should NOT DRIVE or use heavy machinery until tomorrow (because of the sedation medicines used during the test).     FOLLOW UP: Our staff will call the number listed on your records 48-72 hours following your procedure to check on you and address any questions or concerns that you may have regarding the information given to you following your procedure. If we do not reach you, we will leave a message.  We will attempt to reach you two times.  During this call, we will ask if you have developed any symptoms of COVID 19. If you develop any symptoms (ie: fever, flu-like symptoms, shortness of breath, cough etc.) before then, please call [phone].  If you test positive for Covid 19 in the 2 weeks post procedure, please call and report this information to [place].    If any biopsies were taken you will be contacted by phone or by letter within the next 1-3 weeks.  Please call us at [phone] if you have not heard about the biopsies in 3 weeks.    SIGNATURES/CONFIDENTIALITY: You and/or your care partner have signed paperwork which will be entered into your electronic medical record.  These signatures attest to the fact that that the information above on your After Visit Summary has been reviewed and is understood.  Full responsibility of the confidentiality of this discharge information lies with you and/or your care-partner.    Handouts were given to you on polyps and hemorrhoids. You may resume your current medications today. Await biopsy results. Please call if any  questions or concerns.

## 2019-09-19 NOTE — Progress Notes (Signed)
A/ox3, pleased with MAC, report to RN 

## 2019-09-19 NOTE — Progress Notes (Signed)
Pt's states no medical or surgical changes since previsit or office visit. 

## 2019-09-19 NOTE — Progress Notes (Signed)
Tempertre taken by J.B., VS taken by D.T.

## 2019-09-21 ENCOUNTER — Telehealth: Payer: Self-pay

## 2019-09-21 ENCOUNTER — Telehealth: Payer: Self-pay | Admitting: *Deleted

## 2019-09-21 NOTE — Telephone Encounter (Signed)
  Follow up Call-  Call back number 09/19/2019  Post procedure Call Back phone  # [phone]  Permission to leave phone message Yes  Some recent data might be hidden     Patient questions:  Message left to call us if necessary.

## 2019-09-21 NOTE — Telephone Encounter (Signed)
  Follow up Call-  Call back number 09/19/2019  Post procedure Call Back phone  # [phone]  Permission to leave phone message Yes  Some recent data might be hidden     Patient questions:  Do you have a fever, pain , or abdominal swelling? No. Pain Score  0 *  Have you tolerated food without any problems? Yes.    Have you been able to return to your normal activities? Yes.    Do you have any questions about your discharge instructions: Diet   No. Medications  No. Follow up visit  No.  Do you have questions or concerns about your Care? No.  Actions: * If pain score is 4 or above: No action needed, pain <4.  1. Have you developed a fever since your procedure? no  2.   Have you had an respiratory symptoms (SOB or cough) since your procedure? no  3.   Have you tested positive for COVID 19 since your procedure no  4.   Have you had any family members/close contacts diagnosed with the COVID 19 since your procedure?  no   If yes to any of these questions please route to Joylene John, RN and Alphonsa Gin, Therapist, sports.

## 2019-09-27 ENCOUNTER — Encounter: Payer: Self-pay | Admitting: Gastroenterology

## 2019-10-27 DIAGNOSIS — Z125 Encounter for screening for malignant neoplasm of prostate: Secondary | ICD-10-CM | POA: Diagnosis not present

## 2019-10-27 DIAGNOSIS — E7849 Other hyperlipidemia: Secondary | ICD-10-CM | POA: Diagnosis not present

## 2019-10-27 DIAGNOSIS — R7301 Impaired fasting glucose: Secondary | ICD-10-CM | POA: Diagnosis not present

## 2019-10-27 DIAGNOSIS — Z0000 Encounter for general adult medical examination without abnormal findings: Secondary | ICD-10-CM | POA: Diagnosis not present

## 2019-10-31 DIAGNOSIS — R7301 Impaired fasting glucose: Secondary | ICD-10-CM | POA: Diagnosis not present

## 2019-10-31 DIAGNOSIS — G43909 Migraine, unspecified, not intractable, without status migrainosus: Secondary | ICD-10-CM | POA: Diagnosis not present

## 2019-10-31 DIAGNOSIS — R030 Elevated blood-pressure reading, without diagnosis of hypertension: Secondary | ICD-10-CM | POA: Diagnosis not present

## 2019-10-31 DIAGNOSIS — Z0000 Encounter for general adult medical examination without abnormal findings: Secondary | ICD-10-CM | POA: Diagnosis not present

## 2019-10-31 DIAGNOSIS — E785 Hyperlipidemia, unspecified: Secondary | ICD-10-CM | POA: Diagnosis not present

## 2019-10-31 DIAGNOSIS — R82998 Other abnormal findings in urine: Secondary | ICD-10-CM | POA: Diagnosis not present

## 2019-10-31 DIAGNOSIS — Z1331 Encounter for screening for depression: Secondary | ICD-10-CM | POA: Diagnosis not present

## 2019-11-10 DIAGNOSIS — Z1212 Encounter for screening for malignant neoplasm of rectum: Secondary | ICD-10-CM | POA: Diagnosis not present

## 2019-11-30 ENCOUNTER — Ambulatory Visit: Payer: BC Managed Care – PPO | Attending: Internal Medicine

## 2019-11-30 DIAGNOSIS — Z23 Encounter for immunization: Secondary | ICD-10-CM

## 2019-11-30 NOTE — Progress Notes (Signed)
   Covid-19 Vaccination Clinic  Name:  Carlos Booth    MRN: [id number] DOB: November 19, 1955  11/30/2019  [name] was observed post Covid-19 immunization for 15 minutes without incident. He was provided with Vaccine Information Sheet and instruction to access the V-Safe system.   [name] was instructed to call 911 with any severe reactions post vaccine: Marland Kitchen Difficulty breathing  . Swelling of face and throat  . A fast heartbeat  . A bad rash all over body  . Dizziness and weakness   Immunizations Administered    Name Date Dose VIS Date Route   Pfizer COVID-19 Vaccine 11/30/2019  8:48 AM 0.3 mL 07/28/2019 Intramuscular   Manufacturer: [place]   Lot: [id number]   [place]: [id number]

## 2020-01-01 ENCOUNTER — Ambulatory Visit: Payer: Self-pay | Attending: Internal Medicine

## 2020-01-01 DIAGNOSIS — Z23 Encounter for immunization: Secondary | ICD-10-CM

## 2020-01-01 NOTE — Progress Notes (Signed)
   Covid-19 Vaccination Clinic  Name:  KYNDAL WIGGS    MRN: [id number] DOB: 1956/03/14  01/01/2020  Mr. Skog was observed post Covid-19 immunization for 15 minutes without incident. He was provided with Vaccine Information Sheet and instruction to access the V-Safe system.   Mr. Silkworth was instructed to call 911 with any severe reactions post vaccine: Marland Kitchen Difficulty breathing  . Swelling of face and throat  . A fast heartbeat  . A bad rash all over body  . Dizziness and weakness   Immunizations Administered    Name Date Dose VIS Date Route   Pfizer COVID-19 Vaccine 01/01/2020  8:53 AM 0.3 mL 10/11/2018 Intramuscular   Manufacturer: [place]   Lot: [id number]   [place]: [id number]

## 2020-01-02 ENCOUNTER — Ambulatory Visit: Payer: BC Managed Care – PPO

## 2020-08-29 ENCOUNTER — Other Ambulatory Visit: Payer: Self-pay | Admitting: Internal Medicine

## 2020-08-29 DIAGNOSIS — E785 Hyperlipidemia, unspecified: Secondary | ICD-10-CM

## 2020-08-30 ENCOUNTER — Other Ambulatory Visit: Payer: Self-pay | Admitting: Internal Medicine

## 2020-08-30 DIAGNOSIS — R2231 Localized swelling, mass and lump, right upper limb: Secondary | ICD-10-CM

## 2020-09-27 ENCOUNTER — Ambulatory Visit
Admission: RE | Admit: 2020-09-27 | Discharge: 2020-09-27 | Disposition: A | Discharge status: Home / Self Care | Payer: No Typology Code available for payment source | Source: Ambulatory Visit | Attending: Internal Medicine | Admitting: Internal Medicine

## 2020-09-27 DIAGNOSIS — E785 Hyperlipidemia, unspecified: Secondary | ICD-10-CM

## 2020-10-09 ENCOUNTER — Ambulatory Visit: Payer: No Typology Code available for payment source

## 2020-10-09 ENCOUNTER — Ambulatory Visit: Payer: Self-pay

## 2020-10-09 ENCOUNTER — Other Ambulatory Visit: Payer: Self-pay

## 2021-05-01 ENCOUNTER — Other Ambulatory Visit: Payer: Self-pay

## 2021-05-01 ENCOUNTER — Ambulatory Visit: Payer: PPO | Admitting: Podiatry

## 2021-05-01 DIAGNOSIS — L603 Nail dystrophy: Secondary | ICD-10-CM | POA: Diagnosis not present

## 2021-05-02 ENCOUNTER — Encounter: Payer: Self-pay | Admitting: Podiatry

## 2021-05-02 NOTE — Progress Notes (Signed)
  Subjective:  Patient ID: Carlos Booth, male    DOB: 13-Sep-1955,  MRN: [id number]  Chief Complaint  Patient presents with   Nail Problem    Left hallux nail     65 y.o. male presents with the above complaint.  Patient presents with a left great toenail contusion.  Patient states that he dropped something heavy on the foot that led to injuring the nail.  He states is very tender is discolored and is very painful to ambulate with.  Hurts with every step.  He has not seen anyone else prior to seeing me he would like to discuss treatment options he would open to having it removed.   Review of Systems: Negative except as noted in the HPI. Denies N/V/F/Ch.  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 There is no height or weight on file to calculate BMI. Constitutional Well developed. Well nourished.  Vascular Dorsalis pedis pulses palpable bilaterally. Posterior tibial pulses palpable bilaterally. Capillary refill normal to all digits.  No cyanosis or clubbing noted. Pedal hair growth normal.  Neurologic Normal speech. Oriented to person, place, and time. Epicritic sensation to light touch grossly present bilaterally.  Dermatologic Pain on palpation of the entire/total nail on 1st digit  of the left No other open wounds. No skin lesions.  Orthopedic: Normal joint ROM without pain or crepitus bilaterally. No visible deformities. No bony tenderness.   Radiographs: None Assessment:   1. Nail dystrophy    Plan:  Patient was evaluated and treated and all questions answered.  Nail contusion/dystrophy hallux, left -Patient elects to proceed with minor surgery to remove entire toenail today. Consent reviewed and signed by patient. -Entire/total nail excised. See procedure note. -Educated on post-procedure care including soaking. Written instructions provided and reviewed. -Patient to follow up in 2 weeks for nail check.  Procedure: Excision of entire/total nail  Location: Left 1st toe digit Anesthesia: Lidocaine 1% plain; 1.5 mL and Marcaine 0.5% plain; 1.5 mL, digital block. Skin Prep: Betadine. Dressing: Silvadene; telfa; dry, sterile, compression dressing. Technique: Following skin prep, the toe was exsanguinated and a tourniquet was secured at the base of the toe. The affected nail border was freed and excised. The tourniquet was then removed and sterile dressing applied. Disposition: Patient tolerated procedure well. Patient to return in 2 weeks for follow-up.   No follow-ups on file.

## 2021-07-12 IMAGING — CT CT CARDIAC CORONARY ARTERY CALCIUM SCORE
3 series · 14 of 20 positions shown, 16 images · non-contrast
Comparison: No priors.

CLINICAL DATA: 64-year-old Caucasian male with history of
hyperlipidemia and family history of heart disease.

EXAM:
CT CARDIAC CORONARY ARTERY CALCIUM SCORE
TECHNIQUE: Non-contrast imaging through the heart was performed using
prospective ECG gating. Image post processing was performed on an
independent workstation, allowing for quantitative analysis of the
heart and coronary arteries. Note that this exam targets the heart
and the chest was not imaged in its entirety.

[Series 2: calcium scoring 2.00 qr36 bestdiast 71% hrt calciu · axial · 0.41mm/px · z∈[+1447,+1531]mm · 4 of 70 slices shown]
[im 14/70  vessel]
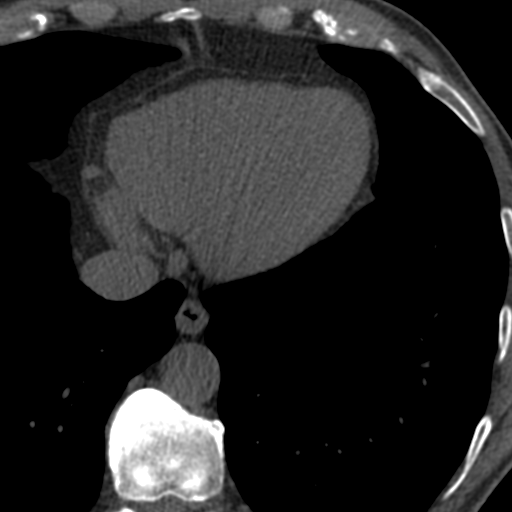
[im 28/70  vessel]
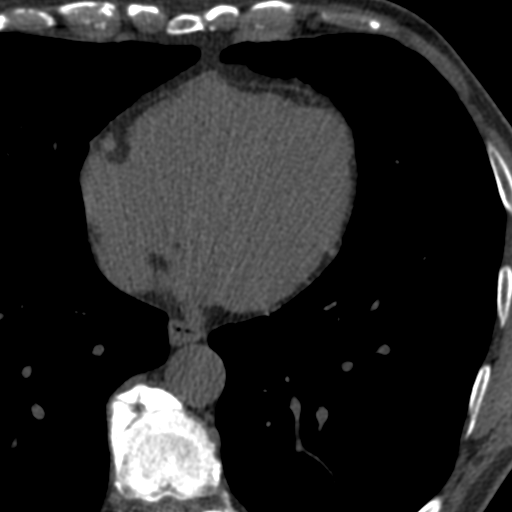
[im 42/70  vessel]
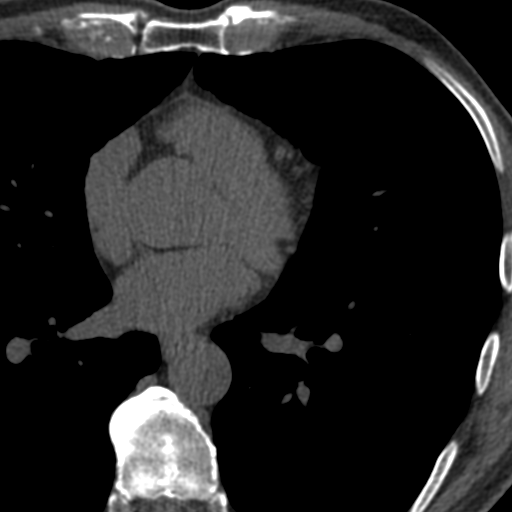
[im 56/70  vessel]
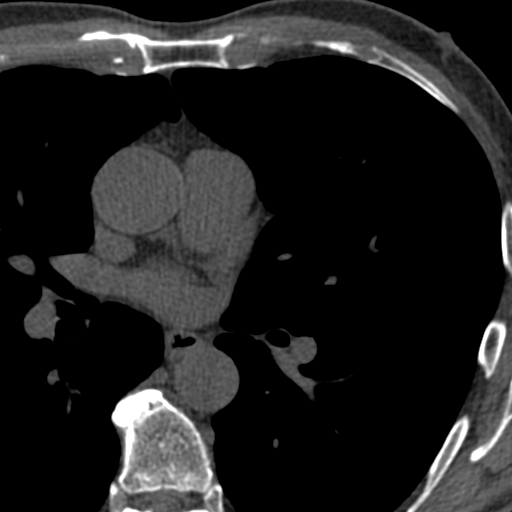

[Series 3: calcium scoring 2.00 br40 bestdiast 71% axial · axial · 0.57mm/px · z∈[+1443,+1535]mm · 5 of 70 slices shown, 7 images]
[im 12/70  vessel]
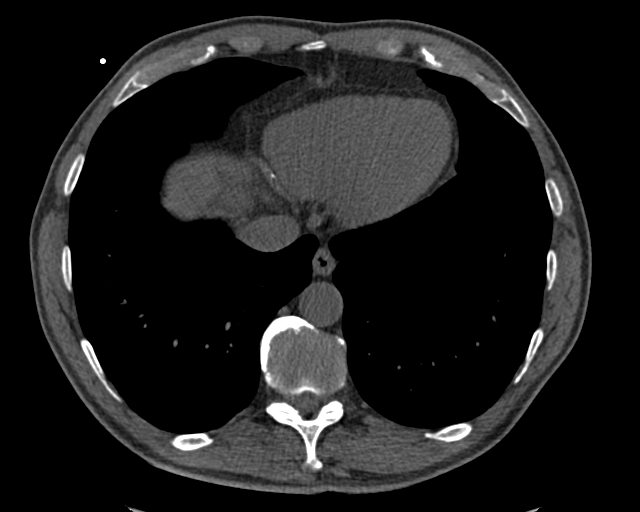
[im 12/70  lung]
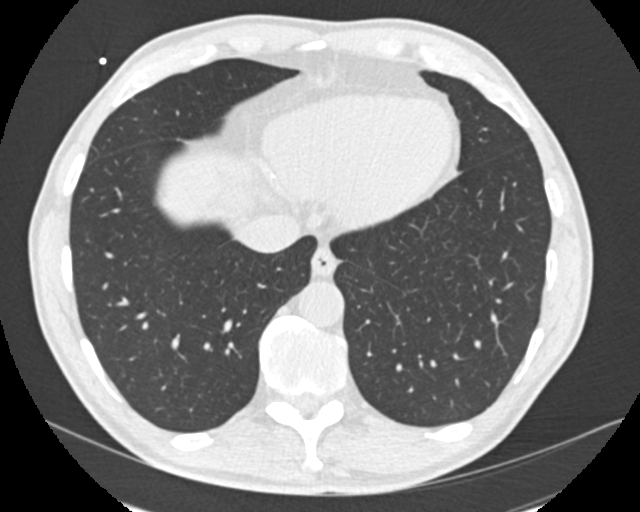
[im 24/70  vessel]
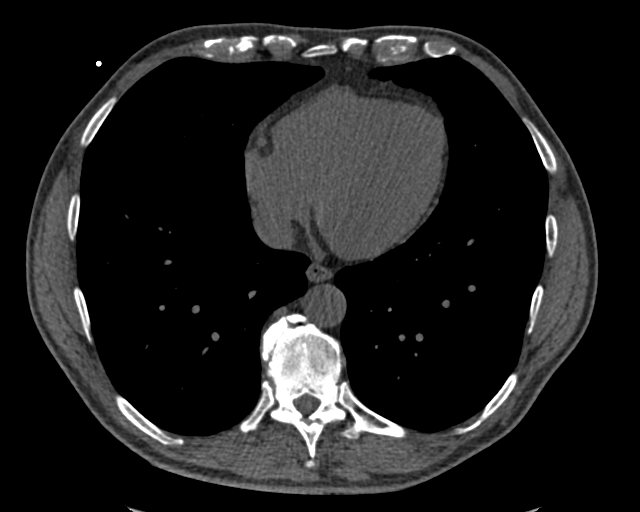
[im 35/70  vessel]
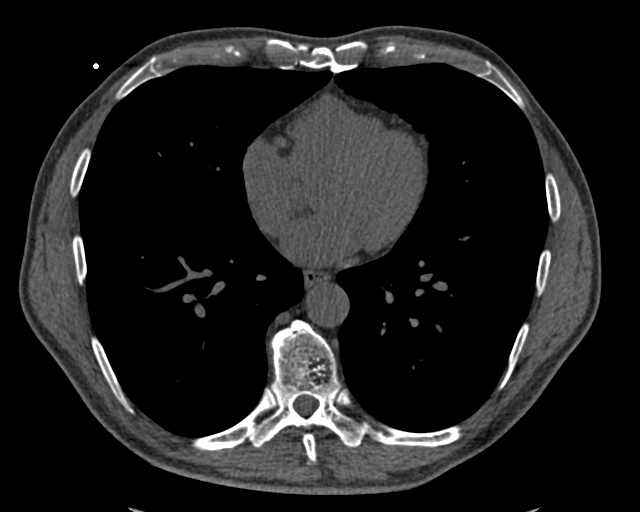
[im 47/70  vessel]
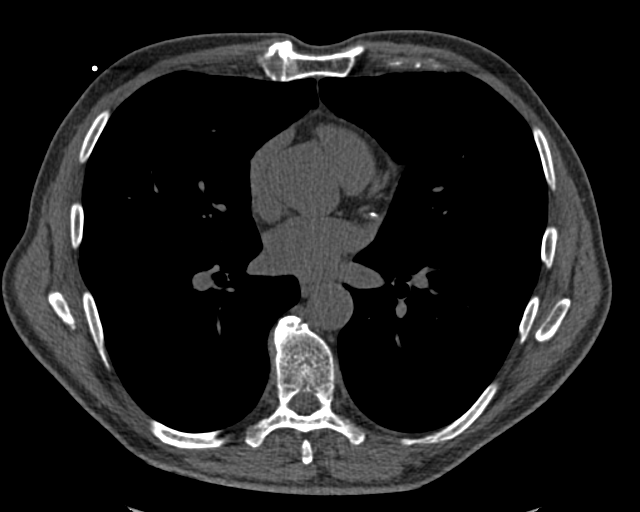
[im 58/70  vessel]
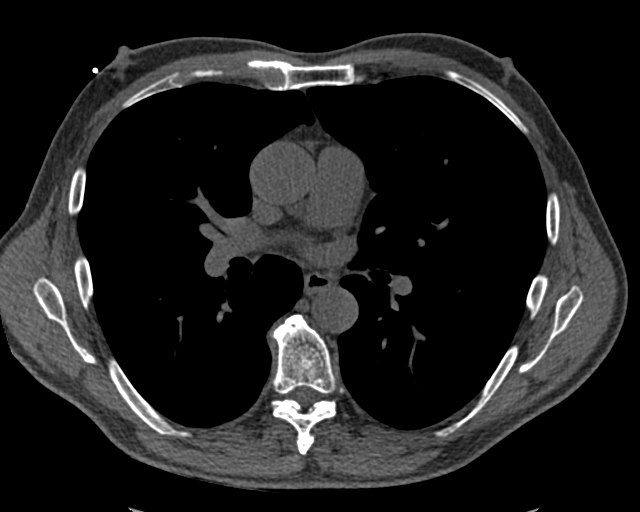
[im 58/70  lung]
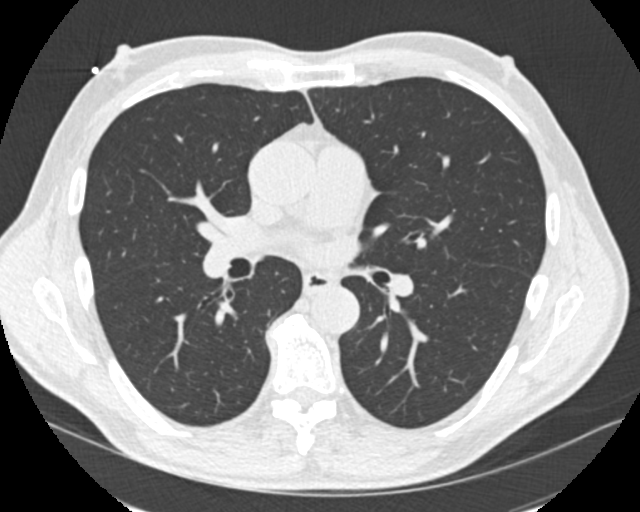

[Series 9: calcium scoring 2.00 br60 bestdiast 71% lungs · axial · 0.57mm/px · z∈[+1443,+1535]mm · 5 of 70 slices shown]
[im 12/70  vessel]
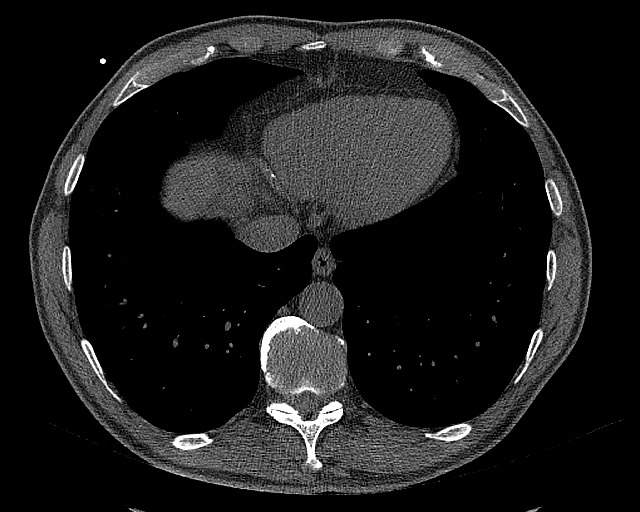
[im 24/70  vessel]
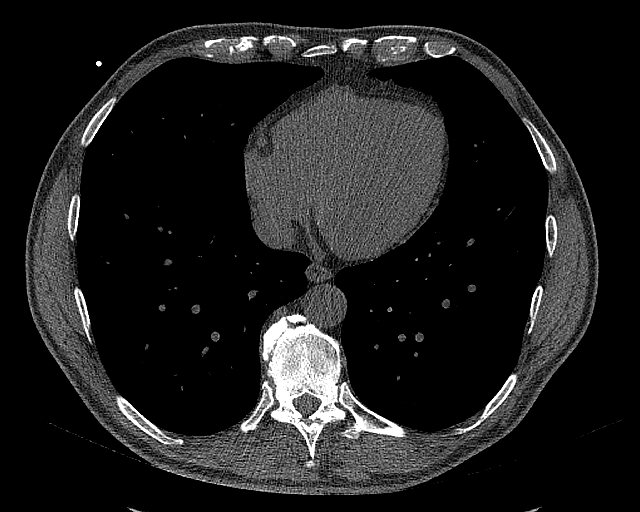
[im 35/70  vessel]
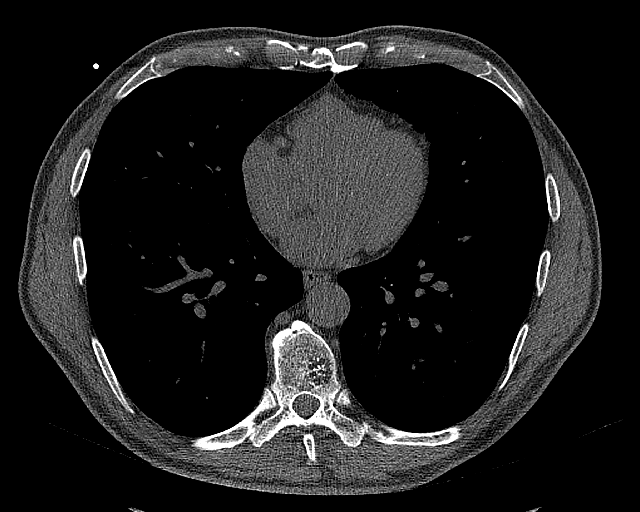
[im 47/70  vessel]
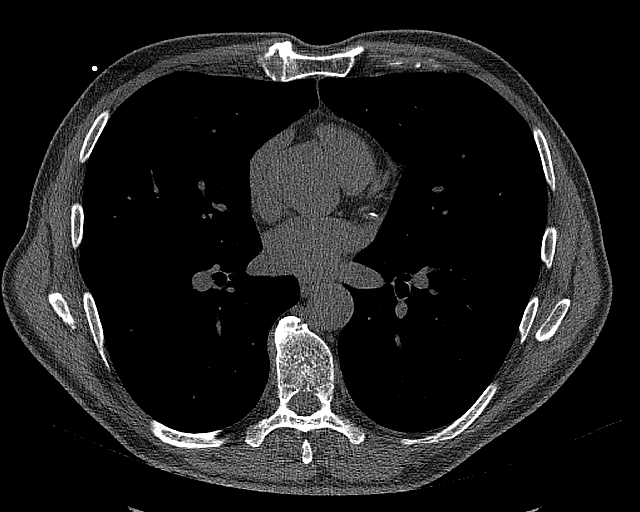
[im 58/70  vessel]
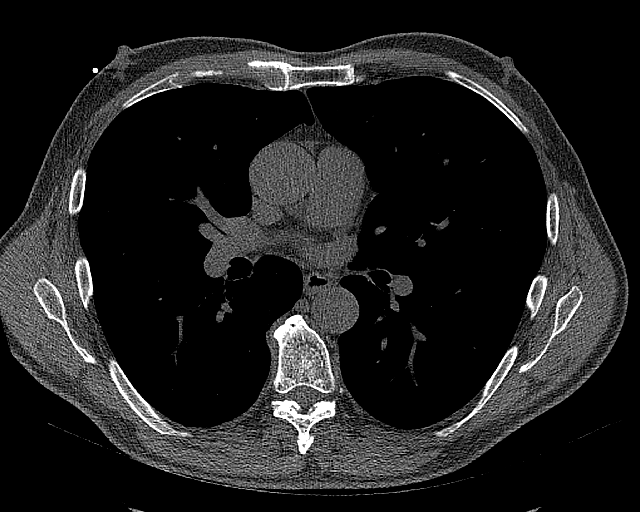

[14 of 20 positions shown; findings below may reference images not displayed]

FINDINGS: CORONARY CALCIUM SCORES:

Left Main: 1

LAD: 1

LCx: 29

RCA: 27

Total Agatston Score: 58

[HOSPITAL] percentile: 49th

AORTA MEASUREMENTS:

Ascending Aorta: 37 mm

Descending Aorta: 26 mm

OTHER FINDINGS:

Within the visualized portions of the thorax there are no suspicious
appearing pulmonary nodules or masses, there is no acute
consolidative airspace disease, no pleural effusions, no
pneumothorax and no lymphadenopathy. Visualized portions of the
upper abdomen are unremarkable. There are no aggressive appearing
lytic or blastic lesions noted in the visualized portions of the
skeleton.
IMPRESSION: 1. Patient's total coronary artery calcium score is 58 which is 49th
percentile for patient's of matched age, gender and race/ethnicity.
Please note that although the presence of coronary artery calcium
documents the presence of coronary artery disease, the severity of
this disease and any potential stenosis cannot be assessed on this
noncontrast CT examination. Assessment for potential risk factor
modification, dietary therapy or pharmacologic therapy may be
warranted, if clinically indicated.
2. No significant incidental noncardiac findings are noted.

## 2021-08-29 DIAGNOSIS — M79662 Pain in left lower leg: Secondary | ICD-10-CM | POA: Diagnosis not present

## 2021-08-29 DIAGNOSIS — S8012XA Contusion of left lower leg, initial encounter: Secondary | ICD-10-CM | POA: Diagnosis not present

## 2021-09-08 ENCOUNTER — Other Ambulatory Visit: Payer: Self-pay

## 2021-09-08 ENCOUNTER — Ambulatory Visit: Payer: PPO | Admitting: Podiatry

## 2021-09-08 ENCOUNTER — Ambulatory Visit (INDEPENDENT_AMBULATORY_CARE_PROVIDER_SITE_OTHER): Payer: PPO

## 2021-09-08 ENCOUNTER — Encounter: Payer: Self-pay | Admitting: Podiatry

## 2021-09-08 DIAGNOSIS — M7752 Other enthesopathy of left foot: Secondary | ICD-10-CM | POA: Diagnosis not present

## 2021-09-08 DIAGNOSIS — D3610 Benign neoplasm of peripheral nerves and autonomic nervous system, unspecified: Secondary | ICD-10-CM

## 2021-09-08 NOTE — Progress Notes (Signed)
Subjective:   Patient ID: Carlos Booth, male   DOB: 66 y.o.   MRN: [id number]   HPI Patient presents stating that he has had pain in his left foot and is concerned about the years of activity and whether or not this is a long-term problem.  Patient is very active does a lot of different type of activities and puts a lot of stress and is developed some digital deformities left along with inflammation   ROS      Objective:  Physical Exam  Neurovascular status intact with patient noted to have moderate rigid contracture lesser digits left with inflammation pain mostly centered around the second and third metatarsal phalangeal joints     Assessment:  Inflammatory capsulitis with digital deformities which may be diminishing the fat pad underneath the metatarsal heads     Plan:  H&P x-rays reviewed today I went ahead and I did inject anesthetic of the area with 100 mg liken Marcaine mixture I then aspirated the second and third MPJ getting small amount of blood out of the third MPJ indicating trauma and I went ahead and I injected with quarter cc dexamethasone Kenalog and applied thick plantar padding discussing long-term orthotics depending on results  X-rays indicate that there is inflammation around the lesser MPJs but I did not note any significant structural arthritis or bone deformities with moderate hammertoe deformity

## 2021-10-06 ENCOUNTER — Ambulatory Visit: Payer: PPO | Admitting: Podiatry

## 2021-12-11 DIAGNOSIS — L814 Other melanin hyperpigmentation: Secondary | ICD-10-CM | POA: Diagnosis not present

## 2021-12-11 DIAGNOSIS — Z789 Other specified health status: Secondary | ICD-10-CM | POA: Diagnosis not present

## 2021-12-11 DIAGNOSIS — L538 Other specified erythematous conditions: Secondary | ICD-10-CM | POA: Diagnosis not present

## 2021-12-11 DIAGNOSIS — R208 Other disturbances of skin sensation: Secondary | ICD-10-CM | POA: Diagnosis not present

## 2021-12-11 DIAGNOSIS — L820 Inflamed seborrheic keratosis: Secondary | ICD-10-CM | POA: Diagnosis not present

## 2021-12-11 DIAGNOSIS — D1801 Hemangioma of skin and subcutaneous tissue: Secondary | ICD-10-CM | POA: Diagnosis not present

## 2022-04-06 DIAGNOSIS — R7301 Impaired fasting glucose: Secondary | ICD-10-CM | POA: Diagnosis not present

## 2022-04-06 DIAGNOSIS — E785 Hyperlipidemia, unspecified: Secondary | ICD-10-CM | POA: Diagnosis not present

## 2022-04-06 DIAGNOSIS — R7989 Other specified abnormal findings of blood chemistry: Secondary | ICD-10-CM | POA: Diagnosis not present

## 2022-04-06 DIAGNOSIS — Z125 Encounter for screening for malignant neoplasm of prostate: Secondary | ICD-10-CM | POA: Diagnosis not present

## 2022-04-13 DIAGNOSIS — R030 Elevated blood-pressure reading, without diagnosis of hypertension: Secondary | ICD-10-CM | POA: Diagnosis not present

## 2022-04-13 DIAGNOSIS — R252 Cramp and spasm: Secondary | ICD-10-CM | POA: Diagnosis not present

## 2022-04-13 DIAGNOSIS — M179 Osteoarthritis of knee, unspecified: Secondary | ICD-10-CM | POA: Diagnosis not present

## 2022-04-13 DIAGNOSIS — Z1331 Encounter for screening for depression: Secondary | ICD-10-CM | POA: Diagnosis not present

## 2022-04-13 DIAGNOSIS — E785 Hyperlipidemia, unspecified: Secondary | ICD-10-CM | POA: Diagnosis not present

## 2022-04-13 DIAGNOSIS — Z0000 Encounter for general adult medical examination without abnormal findings: Secondary | ICD-10-CM | POA: Diagnosis not present

## 2022-04-13 DIAGNOSIS — M5416 Radiculopathy, lumbar region: Secondary | ICD-10-CM | POA: Diagnosis not present

## 2022-04-13 DIAGNOSIS — Z1339 Encounter for screening examination for other mental health and behavioral disorders: Secondary | ICD-10-CM | POA: Diagnosis not present

## 2022-04-13 DIAGNOSIS — R7301 Impaired fasting glucose: Secondary | ICD-10-CM | POA: Diagnosis not present

## 2022-04-13 DIAGNOSIS — Z86010 Personal history of colonic polyps: Secondary | ICD-10-CM | POA: Diagnosis not present

## 2022-04-13 DIAGNOSIS — G43909 Migraine, unspecified, not intractable, without status migrainosus: Secondary | ICD-10-CM | POA: Diagnosis not present

## 2022-05-25 ENCOUNTER — Ambulatory Visit (HOSPITAL_COMMUNITY)
Admission: RE | Admit: 2022-05-25 | Discharge: 2022-05-25 | Disposition: A | Discharge status: Home / Self Care | Payer: PPO | Source: Ambulatory Visit | Attending: Surgery | Admitting: Surgery

## 2022-05-25 ENCOUNTER — Other Ambulatory Visit (HOSPITAL_COMMUNITY): Payer: Self-pay | Admitting: Internal Medicine

## 2022-05-25 DIAGNOSIS — I739 Peripheral vascular disease, unspecified: Secondary | ICD-10-CM | POA: Diagnosis not present

## 2022-05-29 ENCOUNTER — Encounter: Payer: Self-pay | Admitting: Podiatry

## 2022-05-29 ENCOUNTER — Ambulatory Visit: Payer: PPO | Admitting: Podiatry

## 2022-05-29 DIAGNOSIS — L600 Ingrowing nail: Secondary | ICD-10-CM | POA: Diagnosis not present

## 2022-05-31 NOTE — Progress Notes (Signed)
Subjective:   Patient ID: Carlos Booth, male   DOB: 66 y.o.   MRN: [id number]   HPI Patient presents with chronic ingrown toenail deformity left hallux medial border that is painful and make shoe gear difficult   ROS      Objective:  Physical Exam  Neurovascular status intact with incurvated left hallux medial border painful when pressed with slight redness no drainage noted     Assessment:  Ingrown toenail deformity left hallux medial border with pain     Plan:  H&P reviewed condition recommended correction and went ahead today allowed patient to read then signed consent form and infiltrated the left hallux 60 mg Xylocaine Marcaine mixture sterile prep done using sterile instrumentation remove the medial border exposed matrix applied phenol 3 applications 30 seconds followed by alcohol lavage sterile dressing gave instructions on soaks leave dressing on 24 hours take it off earlier if throbbing were to occur and call questions concerns which may arise

## 2022-09-29 ENCOUNTER — Encounter: Payer: Self-pay | Admitting: Gastroenterology

## 2022-10-13 DIAGNOSIS — F4323 Adjustment disorder with mixed anxiety and depressed mood: Secondary | ICD-10-CM | POA: Diagnosis not present

## 2022-10-27 DIAGNOSIS — F4323 Adjustment disorder with mixed anxiety and depressed mood: Secondary | ICD-10-CM | POA: Diagnosis not present

## 2022-11-09 DIAGNOSIS — F4323 Adjustment disorder with mixed anxiety and depressed mood: Secondary | ICD-10-CM | POA: Diagnosis not present

## 2022-11-24 DIAGNOSIS — F4323 Adjustment disorder with mixed anxiety and depressed mood: Secondary | ICD-10-CM | POA: Diagnosis not present

## 2022-12-08 DIAGNOSIS — F4323 Adjustment disorder with mixed anxiety and depressed mood: Secondary | ICD-10-CM | POA: Diagnosis not present

## 2022-12-25 DIAGNOSIS — F4323 Adjustment disorder with mixed anxiety and depressed mood: Secondary | ICD-10-CM | POA: Diagnosis not present

## 2023-01-05 DIAGNOSIS — F4323 Adjustment disorder with mixed anxiety and depressed mood: Secondary | ICD-10-CM | POA: Diagnosis not present

## 2023-01-21 DIAGNOSIS — F4323 Adjustment disorder with mixed anxiety and depressed mood: Secondary | ICD-10-CM | POA: Diagnosis not present

## 2023-02-02 DIAGNOSIS — F4323 Adjustment disorder with mixed anxiety and depressed mood: Secondary | ICD-10-CM | POA: Diagnosis not present

## 2023-02-16 DIAGNOSIS — F4323 Adjustment disorder with mixed anxiety and depressed mood: Secondary | ICD-10-CM | POA: Diagnosis not present

## 2023-02-26 DIAGNOSIS — L700 Acne vulgaris: Secondary | ICD-10-CM | POA: Diagnosis not present

## 2023-02-26 DIAGNOSIS — H9391 Unspecified disorder of right ear: Secondary | ICD-10-CM | POA: Diagnosis not present

## 2023-03-02 DIAGNOSIS — F4323 Adjustment disorder with mixed anxiety and depressed mood: Secondary | ICD-10-CM | POA: Diagnosis not present

## 2023-03-03 DIAGNOSIS — R208 Other disturbances of skin sensation: Secondary | ICD-10-CM | POA: Diagnosis not present

## 2023-03-03 DIAGNOSIS — L570 Actinic keratosis: Secondary | ICD-10-CM | POA: Diagnosis not present

## 2023-03-03 DIAGNOSIS — L821 Other seborrheic keratosis: Secondary | ICD-10-CM | POA: Diagnosis not present

## 2023-03-03 DIAGNOSIS — D225 Melanocytic nevi of trunk: Secondary | ICD-10-CM | POA: Diagnosis not present

## 2023-03-03 DIAGNOSIS — L820 Inflamed seborrheic keratosis: Secondary | ICD-10-CM | POA: Diagnosis not present

## 2023-03-03 DIAGNOSIS — L814 Other melanin hyperpigmentation: Secondary | ICD-10-CM | POA: Diagnosis not present

## 2023-03-03 DIAGNOSIS — L298 Other pruritus: Secondary | ICD-10-CM | POA: Diagnosis not present

## 2023-03-03 DIAGNOSIS — Z789 Other specified health status: Secondary | ICD-10-CM | POA: Diagnosis not present

## 2023-03-03 DIAGNOSIS — L538 Other specified erythematous conditions: Secondary | ICD-10-CM | POA: Diagnosis not present

## 2023-04-13 DIAGNOSIS — F4323 Adjustment disorder with mixed anxiety and depressed mood: Secondary | ICD-10-CM | POA: Diagnosis not present

## 2023-04-16 DIAGNOSIS — W57XXXA Bitten or stung by nonvenomous insect and other nonvenomous arthropods, initial encounter: Secondary | ICD-10-CM | POA: Diagnosis not present

## 2023-04-16 DIAGNOSIS — S91001A Unspecified open wound, right ankle, initial encounter: Secondary | ICD-10-CM | POA: Diagnosis not present

## 2023-04-18 DIAGNOSIS — L03115 Cellulitis of right lower limb: Secondary | ICD-10-CM | POA: Diagnosis not present

## 2023-04-18 DIAGNOSIS — L03119 Cellulitis of unspecified part of limb: Secondary | ICD-10-CM | POA: Diagnosis not present

## 2023-04-27 DIAGNOSIS — L282 Other prurigo: Secondary | ICD-10-CM | POA: Diagnosis not present

## 2023-04-27 DIAGNOSIS — T50905A Adverse effect of unspecified drugs, medicaments and biological substances, initial encounter: Secondary | ICD-10-CM | POA: Diagnosis not present

## 2023-04-27 DIAGNOSIS — F4323 Adjustment disorder with mixed anxiety and depressed mood: Secondary | ICD-10-CM | POA: Diagnosis not present

## 2023-04-28 DIAGNOSIS — R55 Syncope and collapse: Secondary | ICD-10-CM | POA: Diagnosis not present

## 2023-04-28 DIAGNOSIS — Z7689 Persons encountering health services in other specified circumstances: Secondary | ICD-10-CM | POA: Diagnosis not present

## 2023-04-28 DIAGNOSIS — R030 Elevated blood-pressure reading, without diagnosis of hypertension: Secondary | ICD-10-CM | POA: Diagnosis not present

## 2023-04-28 DIAGNOSIS — R21 Rash and other nonspecific skin eruption: Secondary | ICD-10-CM | POA: Diagnosis not present

## 2023-06-07 DIAGNOSIS — Z0001 Encounter for general adult medical examination with abnormal findings: Secondary | ICD-10-CM | POA: Diagnosis not present

## 2023-06-07 DIAGNOSIS — R82998 Other abnormal findings in urine: Secondary | ICD-10-CM | POA: Diagnosis not present

## 2023-06-07 DIAGNOSIS — R7301 Impaired fasting glucose: Secondary | ICD-10-CM | POA: Diagnosis not present

## 2023-06-07 DIAGNOSIS — E785 Hyperlipidemia, unspecified: Secondary | ICD-10-CM | POA: Diagnosis not present

## 2023-06-07 DIAGNOSIS — Z1389 Encounter for screening for other disorder: Secondary | ICD-10-CM | POA: Diagnosis not present

## 2023-06-08 DIAGNOSIS — F4323 Adjustment disorder with mixed anxiety and depressed mood: Secondary | ICD-10-CM | POA: Diagnosis not present

## 2023-06-11 DIAGNOSIS — M179 Osteoarthritis of knee, unspecified: Secondary | ICD-10-CM | POA: Diagnosis not present

## 2023-06-11 DIAGNOSIS — E785 Hyperlipidemia, unspecified: Secondary | ICD-10-CM | POA: Diagnosis not present

## 2023-06-11 DIAGNOSIS — M5416 Radiculopathy, lumbar region: Secondary | ICD-10-CM | POA: Diagnosis not present

## 2023-06-11 DIAGNOSIS — R252 Cramp and spasm: Secondary | ICD-10-CM | POA: Diagnosis not present

## 2023-06-11 DIAGNOSIS — R7301 Impaired fasting glucose: Secondary | ICD-10-CM | POA: Diagnosis not present

## 2023-06-11 DIAGNOSIS — Z860101 Personal history of adenomatous and serrated colon polyps: Secondary | ICD-10-CM | POA: Diagnosis not present

## 2023-06-11 DIAGNOSIS — G43909 Migraine, unspecified, not intractable, without status migrainosus: Secondary | ICD-10-CM | POA: Diagnosis not present

## 2023-06-11 DIAGNOSIS — R030 Elevated blood-pressure reading, without diagnosis of hypertension: Secondary | ICD-10-CM | POA: Diagnosis not present

## 2023-06-11 DIAGNOSIS — Z0000 Encounter for general adult medical examination without abnormal findings: Secondary | ICD-10-CM | POA: Diagnosis not present

## 2023-07-19 ENCOUNTER — Ambulatory Visit (HOSPITAL_COMMUNITY)
Admission: RE | Admit: 2023-07-19 | Discharge: 2023-07-19 | Disposition: A | Discharge status: Home / Self Care | Payer: PPO | Source: Ambulatory Visit | Attending: Surgery | Admitting: Surgery

## 2023-07-19 ENCOUNTER — Other Ambulatory Visit (HOSPITAL_COMMUNITY): Payer: Self-pay | Admitting: Internal Medicine

## 2023-07-19 DIAGNOSIS — Z87891 Personal history of nicotine dependence: Secondary | ICD-10-CM | POA: Insufficient documentation

## 2023-07-19 DIAGNOSIS — Z136 Encounter for screening for cardiovascular disorders: Secondary | ICD-10-CM

## 2023-08-25 DIAGNOSIS — M25561 Pain in right knee: Secondary | ICD-10-CM | POA: Diagnosis not present

## 2023-08-25 DIAGNOSIS — M25562 Pain in left knee: Secondary | ICD-10-CM | POA: Diagnosis not present

## 2023-08-31 DIAGNOSIS — F4323 Adjustment disorder with mixed anxiety and depressed mood: Secondary | ICD-10-CM | POA: Diagnosis not present

## 2023-10-18 DIAGNOSIS — M25512 Pain in left shoulder: Secondary | ICD-10-CM | POA: Diagnosis not present

## 2023-10-18 DIAGNOSIS — H52223 Regular astigmatism, bilateral: Secondary | ICD-10-CM | POA: Diagnosis not present

## 2023-10-18 DIAGNOSIS — H5203 Hypermetropia, bilateral: Secondary | ICD-10-CM | POA: Diagnosis not present

## 2023-10-18 DIAGNOSIS — S40022A Contusion of left upper arm, initial encounter: Secondary | ICD-10-CM | POA: Diagnosis not present

## 2023-10-18 DIAGNOSIS — H2513 Age-related nuclear cataract, bilateral: Secondary | ICD-10-CM | POA: Diagnosis not present

## 2023-10-18 DIAGNOSIS — H524 Presbyopia: Secondary | ICD-10-CM | POA: Diagnosis not present

## 2023-11-08 DIAGNOSIS — J989 Respiratory disorder, unspecified: Secondary | ICD-10-CM | POA: Diagnosis not present

## 2023-11-08 DIAGNOSIS — R058 Other specified cough: Secondary | ICD-10-CM | POA: Diagnosis not present

## 2023-11-12 DIAGNOSIS — M170 Bilateral primary osteoarthritis of knee: Secondary | ICD-10-CM | POA: Diagnosis not present

## 2024-01-03 ENCOUNTER — Other Ambulatory Visit: Payer: Self-pay | Admitting: Internal Medicine

## 2024-01-03 DIAGNOSIS — M5416 Radiculopathy, lumbar region: Secondary | ICD-10-CM

## 2024-01-03 DIAGNOSIS — R0789 Other chest pain: Secondary | ICD-10-CM | POA: Diagnosis not present

## 2024-01-03 DIAGNOSIS — R252 Cramp and spasm: Secondary | ICD-10-CM | POA: Diagnosis not present

## 2024-01-31 ENCOUNTER — Ambulatory Visit
Admission: RE | Admit: 2024-01-31 | Discharge: 2024-01-31 | Disposition: A | Discharge status: Home / Self Care | Source: Ambulatory Visit | Attending: Internal Medicine | Admitting: Internal Medicine

## 2024-01-31 DIAGNOSIS — M47816 Spondylosis without myelopathy or radiculopathy, lumbar region: Secondary | ICD-10-CM | POA: Diagnosis not present

## 2024-01-31 DIAGNOSIS — M5126 Other intervertebral disc displacement, lumbar region: Secondary | ICD-10-CM | POA: Diagnosis not present

## 2024-01-31 DIAGNOSIS — M48061 Spinal stenosis, lumbar region without neurogenic claudication: Secondary | ICD-10-CM | POA: Diagnosis not present

## 2024-01-31 DIAGNOSIS — M5416 Radiculopathy, lumbar region: Secondary | ICD-10-CM

## 2024-03-01 ENCOUNTER — Ambulatory Visit: Admitting: Podiatry

## 2024-03-01 ENCOUNTER — Encounter: Payer: Self-pay | Admitting: Podiatry

## 2024-03-01 VITALS — BP 132/82 | HR 67 | Temp 98.0°F | Resp 18 | Ht 71.0 in | Wt 172.0 lb

## 2024-03-01 DIAGNOSIS — L600 Ingrowing nail: Secondary | ICD-10-CM

## 2024-03-01 NOTE — Patient Instructions (Signed)

## 2024-03-01 NOTE — Progress Notes (Signed)
 Subjective:   Patient ID: Carlos Booth, male   DOB: 68 y.o.   MRN: [id number]   HPI Patient presents with painful ingrown toenail left big toe states it has been bothering him and hard to wear shoe gear with   ROS      Objective:  Physical Exam  Neurovascular status intact with incurvated left hallux medial border painful when pressed     Assessment:  Ingrown toenail deformity left hallux medial border with pain     Plan:  H&P reviewed discussed correction he wants this fixed I allowed him to read a consent form for correction explained procedure risk he signed consent form I infiltrated the left big toe 60 mg Xylocaine  Marcaine  mixture sterile prep done using sterile instrumentation remove the medial border exposed matrix applied phenol 3 applications 36 followed by alcohol of sterile dressing gave instructions on soaks wear dressing 24 hours take it off earlier if throbbing were to occur with all questions answered today

## 2024-03-02 DIAGNOSIS — F330 Major depressive disorder, recurrent, mild: Secondary | ICD-10-CM | POA: Diagnosis not present

## 2024-03-02 DIAGNOSIS — F419 Anxiety disorder, unspecified: Secondary | ICD-10-CM | POA: Diagnosis not present

## 2024-03-03 ENCOUNTER — Encounter: Payer: Self-pay | Admitting: Advanced Practice Midwife

## 2024-04-06 DIAGNOSIS — L820 Inflamed seborrheic keratosis: Secondary | ICD-10-CM | POA: Diagnosis not present

## 2024-04-06 DIAGNOSIS — B078 Other viral warts: Secondary | ICD-10-CM | POA: Diagnosis not present

## 2024-04-06 DIAGNOSIS — L814 Other melanin hyperpigmentation: Secondary | ICD-10-CM | POA: Diagnosis not present

## 2024-04-06 DIAGNOSIS — Z789 Other specified health status: Secondary | ICD-10-CM | POA: Diagnosis not present

## 2024-04-06 DIAGNOSIS — D225 Melanocytic nevi of trunk: Secondary | ICD-10-CM | POA: Diagnosis not present

## 2024-04-06 DIAGNOSIS — L218 Other seborrheic dermatitis: Secondary | ICD-10-CM | POA: Diagnosis not present

## 2024-04-06 DIAGNOSIS — L821 Other seborrheic keratosis: Secondary | ICD-10-CM | POA: Diagnosis not present

## 2024-04-06 DIAGNOSIS — L2989 Other pruritus: Secondary | ICD-10-CM | POA: Diagnosis not present

## 2024-04-06 DIAGNOSIS — L538 Other specified erythematous conditions: Secondary | ICD-10-CM | POA: Diagnosis not present

## 2024-07-04 DIAGNOSIS — E785 Hyperlipidemia, unspecified: Secondary | ICD-10-CM | POA: Diagnosis not present
